# Patient Record
Sex: Female | Born: 1950 | Race: Black or African American | Hispanic: No | Marital: Married | Smoking: Never smoker
Health system: Southern US, Community
[De-identification: ages and names within clinical notes are randomized; demographics above are authoritative.]

## PROBLEM LIST (undated history)

## (undated) DIAGNOSIS — K219 Gastro-esophageal reflux disease without esophagitis: Secondary | ICD-10-CM

## (undated) DIAGNOSIS — R7302 Impaired glucose tolerance (oral): Secondary | ICD-10-CM

## (undated) DIAGNOSIS — N814 Uterovaginal prolapse, unspecified: Secondary | ICD-10-CM

## (undated) DIAGNOSIS — G43909 Migraine, unspecified, not intractable, without status migrainosus: Secondary | ICD-10-CM

## (undated) DIAGNOSIS — K602 Anal fissure, unspecified: Secondary | ICD-10-CM

## (undated) DIAGNOSIS — F32A Depression, unspecified: Secondary | ICD-10-CM

## (undated) DIAGNOSIS — IMO0001 Reserved for inherently not codable concepts without codable children: Secondary | ICD-10-CM

## (undated) DIAGNOSIS — K648 Other hemorrhoids: Secondary | ICD-10-CM

## (undated) DIAGNOSIS — L719 Rosacea, unspecified: Secondary | ICD-10-CM

## (undated) DIAGNOSIS — E739 Lactose intolerance, unspecified: Secondary | ICD-10-CM

## (undated) DIAGNOSIS — R011 Cardiac murmur, unspecified: Secondary | ICD-10-CM

## (undated) DIAGNOSIS — F419 Anxiety disorder, unspecified: Secondary | ICD-10-CM

## (undated) DIAGNOSIS — O24419 Gestational diabetes mellitus in pregnancy, unspecified control: Secondary | ICD-10-CM

## (undated) DIAGNOSIS — M2550 Pain in unspecified joint: Secondary | ICD-10-CM

## (undated) DIAGNOSIS — K589 Irritable bowel syndrome without diarrhea: Secondary | ICD-10-CM

## (undated) DIAGNOSIS — K5790 Diverticulosis of intestine, part unspecified, without perforation or abscess without bleeding: Secondary | ICD-10-CM

## (undated) DIAGNOSIS — K6389 Other specified diseases of intestine: Secondary | ICD-10-CM

## (undated) DIAGNOSIS — E785 Hyperlipidemia, unspecified: Secondary | ICD-10-CM

## (undated) DIAGNOSIS — G4700 Insomnia, unspecified: Secondary | ICD-10-CM

## (undated) DIAGNOSIS — F329 Major depressive disorder, single episode, unspecified: Secondary | ICD-10-CM

## (undated) HISTORY — DX: Rosacea, unspecified: L71.9

## (undated) HISTORY — DX: Other specified diseases of intestine: K63.89

## (undated) HISTORY — DX: Depression, unspecified: F32.A

## (undated) HISTORY — DX: Impaired glucose tolerance (oral): R73.02

## (undated) HISTORY — DX: Gestational diabetes mellitus in pregnancy, unspecified control: O24.419

## (undated) HISTORY — DX: Irritable bowel syndrome without diarrhea: K58.9

## (undated) HISTORY — DX: Lactose intolerance, unspecified: E73.9

## (undated) HISTORY — DX: Anal fissure, unspecified: K60.2

## (undated) HISTORY — DX: Major depressive disorder, single episode, unspecified: F32.9

## (undated) HISTORY — PX: COLONOSCOPY: SHX174

## (undated) HISTORY — DX: Anxiety disorder, unspecified: F41.9

## (undated) HISTORY — DX: Diverticulosis of intestine, part unspecified, without perforation or abscess without bleeding: K57.90

## (undated) HISTORY — DX: Other hemorrhoids: K64.8

## (undated) HISTORY — DX: Gastro-esophageal reflux disease without esophagitis: K21.9

## (undated) HISTORY — DX: Migraine, unspecified, not intractable, without status migrainosus: G43.909

## (undated) HISTORY — DX: Uterovaginal prolapse, unspecified: N81.4

## (undated) HISTORY — DX: Insomnia, unspecified: G47.00

## (undated) HISTORY — DX: Hyperlipidemia, unspecified: E78.5

## (undated) HISTORY — PX: UPPER GASTROINTESTINAL ENDOSCOPY: SHX188

## (undated) HISTORY — DX: Reserved for inherently not codable concepts without codable children: IMO0001

## (undated) HISTORY — DX: Cardiac murmur, unspecified: R01.1

## (undated) HISTORY — DX: Pain in unspecified joint: M25.50

---

## 1973-08-11 DIAGNOSIS — L719 Rosacea, unspecified: Secondary | ICD-10-CM

## 1973-08-11 HISTORY — DX: Rosacea, unspecified: L71.9

## 1996-08-11 DIAGNOSIS — K589 Irritable bowel syndrome without diarrhea: Secondary | ICD-10-CM

## 1996-08-11 DIAGNOSIS — E739 Lactose intolerance, unspecified: Secondary | ICD-10-CM

## 1996-08-11 HISTORY — DX: Irritable bowel syndrome, unspecified: K58.9

## 1996-08-11 HISTORY — DX: Lactose intolerance, unspecified: E73.9

## 1998-04-27 ENCOUNTER — Ambulatory Visit (HOSPITAL_COMMUNITY): Admission: RE | Admit: 1998-04-27 | Discharge: 1998-04-27 | Payer: Self-pay | Admitting: Obstetrics and Gynecology

## 1998-08-11 DIAGNOSIS — R7302 Impaired glucose tolerance (oral): Secondary | ICD-10-CM

## 1998-08-11 HISTORY — DX: Impaired glucose tolerance (oral): R73.02

## 1999-03-07 ENCOUNTER — Other Ambulatory Visit: Admission: RE | Admit: 1999-03-07 | Discharge: 1999-03-07 | Payer: Self-pay | Admitting: Obstetrics and Gynecology

## 1999-05-28 ENCOUNTER — Encounter: Payer: Self-pay | Admitting: Obstetrics and Gynecology

## 1999-05-28 ENCOUNTER — Ambulatory Visit (HOSPITAL_COMMUNITY): Admission: RE | Admit: 1999-05-28 | Discharge: 1999-05-28 | Payer: Self-pay | Admitting: Obstetrics and Gynecology

## 1999-07-12 ENCOUNTER — Encounter: Payer: Self-pay | Admitting: Family Medicine

## 1999-07-12 ENCOUNTER — Encounter: Admission: RE | Admit: 1999-07-12 | Discharge: 1999-07-12 | Payer: Self-pay | Admitting: Family Medicine

## 1999-08-12 DIAGNOSIS — K219 Gastro-esophageal reflux disease without esophagitis: Secondary | ICD-10-CM

## 1999-08-12 HISTORY — DX: Gastro-esophageal reflux disease without esophagitis: K21.9

## 2000-04-09 ENCOUNTER — Encounter: Admission: RE | Admit: 2000-04-09 | Discharge: 2000-04-09 | Payer: Self-pay | Admitting: Obstetrics and Gynecology

## 2000-04-09 ENCOUNTER — Encounter: Payer: Self-pay | Admitting: Obstetrics and Gynecology

## 2000-06-29 ENCOUNTER — Other Ambulatory Visit: Admission: RE | Admit: 2000-06-29 | Discharge: 2000-06-29 | Payer: Self-pay | Admitting: Obstetrics and Gynecology

## 2001-06-11 ENCOUNTER — Encounter: Payer: Self-pay | Admitting: Obstetrics and Gynecology

## 2001-06-11 ENCOUNTER — Ambulatory Visit (HOSPITAL_COMMUNITY): Admission: RE | Admit: 2001-06-11 | Discharge: 2001-06-11 | Payer: Self-pay | Admitting: Obstetrics and Gynecology

## 2001-06-29 ENCOUNTER — Other Ambulatory Visit: Admission: RE | Admit: 2001-06-29 | Discharge: 2001-06-29 | Payer: Self-pay | Admitting: Obstetrics and Gynecology

## 2001-08-11 DIAGNOSIS — G4700 Insomnia, unspecified: Secondary | ICD-10-CM

## 2001-08-11 DIAGNOSIS — K5790 Diverticulosis of intestine, part unspecified, without perforation or abscess without bleeding: Secondary | ICD-10-CM

## 2001-08-11 HISTORY — DX: Diverticulosis of intestine, part unspecified, without perforation or abscess without bleeding: K57.90

## 2001-08-11 HISTORY — DX: Insomnia, unspecified: G47.00

## 2001-10-22 ENCOUNTER — Encounter: Admission: RE | Admit: 2001-10-22 | Discharge: 2001-10-22 | Payer: Self-pay | Admitting: Family Medicine

## 2001-10-22 ENCOUNTER — Encounter: Payer: Self-pay | Admitting: Family Medicine

## 2002-04-07 ENCOUNTER — Ambulatory Visit (HOSPITAL_COMMUNITY): Admission: RE | Admit: 2002-04-07 | Discharge: 2002-04-07 | Payer: Self-pay | Admitting: Gastroenterology

## 2002-08-12 ENCOUNTER — Ambulatory Visit (HOSPITAL_COMMUNITY): Admission: RE | Admit: 2002-08-12 | Discharge: 2002-08-12 | Payer: Self-pay | Admitting: Obstetrics and Gynecology

## 2002-08-12 ENCOUNTER — Encounter: Payer: Self-pay | Admitting: Obstetrics and Gynecology

## 2002-08-31 ENCOUNTER — Other Ambulatory Visit: Admission: RE | Admit: 2002-08-31 | Discharge: 2002-08-31 | Payer: Self-pay | Admitting: Obstetrics and Gynecology

## 2002-12-28 ENCOUNTER — Encounter: Admission: RE | Admit: 2002-12-28 | Discharge: 2002-12-28 | Payer: Self-pay | Admitting: Obstetrics and Gynecology

## 2002-12-28 ENCOUNTER — Encounter: Payer: Self-pay | Admitting: Obstetrics and Gynecology

## 2003-08-12 DIAGNOSIS — IMO0001 Reserved for inherently not codable concepts without codable children: Secondary | ICD-10-CM

## 2003-08-12 HISTORY — DX: Reserved for inherently not codable concepts without codable children: IMO0001

## 2003-09-20 ENCOUNTER — Other Ambulatory Visit: Admission: RE | Admit: 2003-09-20 | Discharge: 2003-09-20 | Payer: Self-pay | Admitting: Obstetrics and Gynecology

## 2003-11-16 ENCOUNTER — Ambulatory Visit (HOSPITAL_COMMUNITY): Admission: RE | Admit: 2003-11-16 | Discharge: 2003-11-16 | Payer: Self-pay | Admitting: Family Medicine

## 2004-11-22 ENCOUNTER — Encounter: Admission: RE | Admit: 2004-11-22 | Discharge: 2004-11-22 | Payer: Self-pay | Admitting: Gastroenterology

## 2004-12-30 ENCOUNTER — Ambulatory Visit (HOSPITAL_COMMUNITY): Admission: RE | Admit: 2004-12-30 | Discharge: 2004-12-30 | Payer: Self-pay | Admitting: Obstetrics and Gynecology

## 2005-10-07 ENCOUNTER — Other Ambulatory Visit: Admission: RE | Admit: 2005-10-07 | Discharge: 2005-10-07 | Payer: Self-pay | Admitting: Obstetrics and Gynecology

## 2006-01-01 ENCOUNTER — Ambulatory Visit (HOSPITAL_COMMUNITY): Admission: RE | Admit: 2006-01-01 | Discharge: 2006-01-01 | Payer: Self-pay | Admitting: Obstetrics and Gynecology

## 2006-06-18 ENCOUNTER — Emergency Department (HOSPITAL_COMMUNITY): Admission: EM | Admit: 2006-06-18 | Discharge: 2006-06-18 | Payer: Self-pay | Admitting: Family Medicine

## 2007-01-07 ENCOUNTER — Ambulatory Visit (HOSPITAL_COMMUNITY): Admission: RE | Admit: 2007-01-07 | Discharge: 2007-01-07 | Payer: Self-pay | Admitting: Obstetrics and Gynecology

## 2008-05-07 ENCOUNTER — Emergency Department (HOSPITAL_COMMUNITY): Admission: EM | Admit: 2008-05-07 | Discharge: 2008-05-07 | Payer: Self-pay | Admitting: Family Medicine

## 2008-08-07 ENCOUNTER — Ambulatory Visit (HOSPITAL_COMMUNITY): Admission: RE | Admit: 2008-08-07 | Discharge: 2008-08-07 | Payer: Self-pay | Admitting: Family Medicine

## 2008-08-08 ENCOUNTER — Encounter: Admission: RE | Admit: 2008-08-08 | Discharge: 2008-08-08 | Payer: Self-pay | Admitting: Family Medicine

## 2009-08-31 ENCOUNTER — Ambulatory Visit (HOSPITAL_COMMUNITY): Admission: RE | Admit: 2009-08-31 | Discharge: 2009-08-31 | Payer: Self-pay | Admitting: Obstetrics and Gynecology

## 2010-06-21 ENCOUNTER — Encounter: Admission: RE | Admit: 2010-06-21 | Discharge: 2010-06-21 | Payer: Self-pay | Admitting: Family Medicine

## 2010-09-05 ENCOUNTER — Ambulatory Visit (HOSPITAL_COMMUNITY)
Admission: RE | Admit: 2010-09-05 | Discharge: 2010-09-05 | Payer: Self-pay | Source: Home / Self Care | Attending: Obstetrics and Gynecology | Admitting: Obstetrics and Gynecology

## 2010-11-13 ENCOUNTER — Encounter: Payer: BC Managed Care – PPO | Attending: Family Medicine | Admitting: *Deleted

## 2010-11-13 DIAGNOSIS — E739 Lactose intolerance, unspecified: Secondary | ICD-10-CM | POA: Insufficient documentation

## 2010-11-13 DIAGNOSIS — Z713 Dietary counseling and surveillance: Secondary | ICD-10-CM | POA: Insufficient documentation

## 2010-12-27 NOTE — Op Note (Signed)
   [name], [name] NO.:  [id number]   MEDICAL RECORD NO.:  [id number]                   PATIENT TYPE:  AMB   LOCATION:  ENDO                                 FACILITY:  [facility]   PHYSICIAN:  Charna Elizabeth, M.D.                   DATE OF BIRTH:  1950-08-28   DATE OF PROCEDURE:  04/07/2002  DATE OF DISCHARGE:  04/07/2002                                 OPERATIVE REPORT   PROCEDURE:  Screening colonoscopy.   ENDOSCOPIST:  Charna Elizabeth, M.D.   INSTRUMENT USED:  Olympus video colonoscope.   INDICATIONS FOR PROCEDURE:  60 year old white female with a history of loose  stools, rule out colitis.   PREPROCEDURE PREPARATION:  Informed consent was obtained from the patient.  The patient was fasted for eight hours prior to the procedure and prepped  with a bottle of magnesium citrate and a gallon of NuLytely the night prior  to the procedure.   PREPROCEDURE PHYSICAL:  Patient with stable vital signs.  Neck supple.  Chest clear to auscultation.  S1 and S2 regular.  Abdomen soft with normal  bowel sounds.   DESCRIPTION OF PROCEDURE:  The patient was placed in the left lateral  decubitus position, sedated with 80 mg of Demerol and 8 mg Versed  intravenously.  Once the patient was adequately sedated, maintained on low  flow oxygen, continuous cardiac monitoring, the Olympus video colonoscope  was advanced from the rectum to the cecum without difficulty.  Except for a  few scattered diverticula found in the colon, no masses, polyps, erosions,  ulcerations, etc., were seen.  The patient tolerated the procedure well  without complications.  The procedure was completed to the cecum, the  appendiceal orifice and ileocecal valve along with the terminal ileum were  clearly visualized.   IMPRESSION:  Essentially unrevealing colonoscopy except for a few scattered  diverticula.   RECOMMENDATIONS:  1. A high fiber diet has been recommended on an outpatient basis.  2.  Repeat guaiac testing.  3. Further recommendations made as need arises.                                               Charna Elizabeth, M.D.    JM/MEDQ  D:  04/07/2002  T:  04/11/2002  Job:  [id number]   cc:   Carolyne Fiscal, M.D.

## 2011-01-20 ENCOUNTER — Ambulatory Visit: Payer: BC Managed Care – PPO | Admitting: Internal Medicine

## 2011-02-09 ENCOUNTER — Inpatient Hospital Stay (INDEPENDENT_AMBULATORY_CARE_PROVIDER_SITE_OTHER)
Admission: RE | Admit: 2011-02-09 | Discharge: 2011-02-09 | Disposition: A | Discharge status: Home / Self Care | Payer: BC Managed Care – PPO | Source: Ambulatory Visit | Attending: Emergency Medicine | Admitting: Emergency Medicine

## 2011-02-09 DIAGNOSIS — T6391XA Toxic effect of contact with unspecified venomous animal, accidental (unintentional), initial encounter: Secondary | ICD-10-CM

## 2011-02-17 ENCOUNTER — Encounter: Payer: BC Managed Care – PPO | Attending: Family Medicine | Admitting: *Deleted

## 2011-02-17 ENCOUNTER — Encounter: Payer: Self-pay | Admitting: *Deleted

## 2011-02-17 DIAGNOSIS — Z713 Dietary counseling and surveillance: Secondary | ICD-10-CM | POA: Insufficient documentation

## 2011-02-17 DIAGNOSIS — E739 Lactose intolerance, unspecified: Secondary | ICD-10-CM | POA: Insufficient documentation

## 2011-02-17 NOTE — Progress Notes (Signed)
Medical Nutrition Therapy:  Appt start time: 9:00am; Appt end time: 9:30am  Assessment:  Primary concerns today: Follow up visit - Glucose Intolerance/Diet Therapy  Weight today: 163.8 lbs Weight change: + 3.8 lbs BMI: 26.4 kg/m2   Pt reports increased CHO consumption 2/2 dx of salicylate intolerance in significant other.  Reports 3 meals/day plus ~1 snack.  Inconsistent CHO intake noted. Consuming more starchy vegetables and trail mix with dark chocolate, cranberries, raisins. No exercise outside of ADLs and light yard work.  Dietary intake includes: peas, corn, carrots, oatmeal with raisins, [place] sausage biscuits ("not often"), fruit (1 cup), 1/4 pc cheesecake, trail mix, some sugar-sweetened beverages (usually uses artificial sweetener).  24-hr Dietary Recall: B: 65 gm CHO L: 15 gm CHO D: 30-45 gm CHO Snack (@ hs): 30-45 gm CHO  Medications:  Reports Actonel 35 mg taken once weekly (since 02/2009).  Progress Towards Goal(s):  No progress noted.   Nutritional Diagnosis: Disordered eating pattern related to inconsistent carbohydrate intake as evidenced by patient-reported food recall and an A1c of 6.0%.  Intervention:   Consistent CHO intake over 3 meals plus 1-2 snacks per day (30-40 gm CHO @ meals/15 gm @ snacks)  Add protein to all meals and snacks  Resume exercising via walking or try water walking  Keep food journal on sheet provided for next visit -- Aim for 2-3 days per week and 1 day on weekends  Follow up PRN  Monitoring/Evaluation:  Dietary intake, exercise, and body weight.  Call or email with questions or concerns and follow up as needed.

## 2011-02-17 NOTE — Patient Instructions (Signed)
New patient set goals:  Consistent carbohydrate intake over 3 meals and 1-2 snacks per day (30-40 gm @ meals/15 gm @ snacks)  Add protein to all meals and snacks  Resume exercising via walking (or try water walking); Aim for 3 days per week for 20-30 min. each   Keep food journal on sheet provided for next visit -- Aim for 2-3 days per week and 1 day on weekends  Follow up PRN

## 2011-02-17 NOTE — Progress Notes (Deleted)
Subjective:     Patient ID: Deborah Conner, female   DOB: 1951-02-06, 60 y.o.   MRN: [id number]  HPI   Review of Systems     Objective:   Physical Exam     Assessment:     ***    Plan:     ***

## 2011-02-24 ENCOUNTER — Encounter: Payer: Self-pay | Admitting: Internal Medicine

## 2011-02-24 ENCOUNTER — Ambulatory Visit (INDEPENDENT_AMBULATORY_CARE_PROVIDER_SITE_OTHER): Payer: BC Managed Care – PPO | Admitting: Internal Medicine

## 2011-02-24 VITALS — BP 116/78 | HR 92 | Ht 66.0 in | Wt 166.6 lb

## 2011-02-24 DIAGNOSIS — R1319 Other dysphagia: Secondary | ICD-10-CM

## 2011-02-24 DIAGNOSIS — Z1211 Encounter for screening for malignant neoplasm of colon: Secondary | ICD-10-CM

## 2011-02-24 DIAGNOSIS — K649 Unspecified hemorrhoids: Secondary | ICD-10-CM

## 2011-02-24 DIAGNOSIS — K589 Irritable bowel syndrome without diarrhea: Secondary | ICD-10-CM

## 2011-02-24 DIAGNOSIS — E739 Lactose intolerance, unspecified: Secondary | ICD-10-CM

## 2011-02-24 MED ORDER — HYOSCYAMINE SULFATE 0.125 MG SL SUBL: 0.1250 mg | SUBLINGUAL_TABLET | SUBLINGUAL | Status: DC | PRN

## 2011-02-24 MED ORDER — PEG-KCL-NACL-NASULF-NA ASC-C 100 G PO SOLR: 1.0000 | Freq: Once | ORAL | Status: DC

## 2011-02-24 NOTE — Assessment & Plan Note (Signed)
Sounds like esophageal dysphagia. Needs to be assessed with EGD and possible esophageal dilation depending upon what is seen. She is correct that Actonel could be contributing.GERD, motility disturbance and even neoplasm also possible. Risks, benefits and indications of endoscopy and dilation described and she understands.

## 2011-02-24 NOTE — Assessment & Plan Note (Signed)
Continue using lactaid or avoiding.

## 2011-02-24 NOTE — Assessment & Plan Note (Signed)
Sounds like she bled once from these as diagnosed by Dr. Duaine Dredge.

## 2011-02-24 NOTE — Progress Notes (Signed)
Subjective:    Patient ID: Deborah Conner, female    DOB: [birth year], 60 y.o.   MRN: [id number]  HPI this is a very pleasant 60 year old [place] American woman who presents for evaluation of prior rectal bleeding. She also has complaints of dysphagia.  In April she had one episode of rectal bleeding, she was assessed by her PCP and determined to have hemorrhoids, she use hemorrhoidal suppositories with hydrocortisone and has had no more symptoms. She did a colonoscopy about 9-10 years ago which was unremarkable apparently.  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 This is been something that is less frequent over time but is still a problem. In years past she used level sublingual with relief. She has tried hyoscyamine tablets by mouth without the same benefit.  She is also describing 7-8 months of intermittent solid food dysphagia described as a subxiphoid sticking point with solid foods, she'll drink water and the food will pass. This is occurring about once a month does have a history of an EGD for abdominal pain UA and see back in 2007 which looked normal. She takes Actonel for about 2 years and is concerned this may be related to her swallowing difficulties. Her GI review of systems is otherwise notable for some heartburn, gas symptoms and she has changed weight.   Past Medical History  Diagnosis Date  . Gestational diabetes   . IBS (irritable bowel syndrome) 1998  . Glucose intolerance (impaired glucose tolerance) 2000  . Anal fissure   . Internal hemorrhoids   . Diverticulosis 2003  . Anxiety and depression 1998/1973    some panic  . HLD (hyperlipidemia)   . Lactose intolerance 1998  . Osteoporosis 2010  . Polyarthralgia   . Rosacea 1975  . Tinnitus 1999  . Migraine headache childhood  . Seborrheic dermatitis age 53  . GERD (gastroesophageal reflux disease) 2001  . Insomnia  2003  . Aortic sclerosis 2005  . DJD (degenerative joint disease), lumbosacral 2009   Past Surgical History  Procedure Date  . Upper gastrointestinal endoscopy 01/30/2006    Normal [facility])  . Colonoscopy 2003    reports that she has never smoked. She has never used smokeless tobacco. She reports that she does not drink alcohol or use illicit drugs. family history includes Arthritis in her sister; Diabetes in her sister; Gout in her mother; Hearing loss in her father; Heart disease in her mother; Hypertension in her mother; Osteoporosis in her mother; and Raynaud syndrome in her daughter. Allergies  Allergen Reactions  . Lactose Intolerance (Gi)   . Chlorthalidone Rash     Current outpatient prescriptions:ALPRAZolam (XANAX) 0.25 MG tablet, Take 0.125 mg by mouth as needed. (Pt takes 1/2 of 0.25 mg prn) , Disp: , Rfl: ;  aspirin 81 MG tablet, Take 81 mg by mouth daily.  , Disp: , Rfl: ;  CALCIUM PO, Take by mouth daily. Dose not specified. , Disp: , Rfl: ;  cholecalciferol (VITAMIN D) 1000 UNITS tablet, Take 1,000 Units by mouth 2 (two) times daily.  , Disp: , Rfl:  Evening Primrose Oil CAPS, Take 1 capsule by mouth daily.  , Disp: , Rfl: ;  FUROSEMIDE PO, Take 10 mg by mouth daily.  , Disp: , Rfl: ;  Omega-3 Fatty Acids (FISH OIL PO), Take 1 tablet by mouth daily.  , Disp: , Rfl: ;  risedronate (ACTONEL) 35 MG tablet, Take 35 mg  by mouth every 7 (seven) days. with water on empty stomach, nothing by mouth or lie down for next 30 minutes. , Disp: , Rfl:  vitamin B-12 (CYANOCOBALAMIN) 100 MCG tablet, Take 50 mcg by mouth daily.  , Disp: , Rfl: ;  hyoscyamine (LEVSIN SL) 0.125 MG SL tablet, Place 1 tablet (0.125 mg total) under the tongue every 4 (four) hours as needed for cramping. And to prevent diarrhea., Disp: 60 tablet, Rfl: 3;  peg 3350 powder (MOVIPREP) 100 G SOLR, Take 1 kit (100 g total) by mouth once., Disp: 1 kit, Rfl: 0    Review of Systems All other review of systems negative except as  above    Objective:   Physical Exam This is a well-developed well-nourished black woman in no acute distress Her eyes are anicteric pupils round and reactive to light The mouth and posterior pharynx are clear of lesions teeth are in good repair The neck is supple without mass or thyromegaly The lungs are clear to auscultation Heart S1-S2 no rubs murmurs or gallops heard Abdomen soft and nontender without organomegaly or mass Rectal examination is deferred Lower extremities are free of edema She is alert and oriented and has an appropriate mood and affect There are no supraclavicular or cervical lymph nodes detected on exam       Assessment & Plan:  I think the rectal bleeding was hemorrhoidal in origin since it was only one time and responded to treatment. We will have her perform a screening colonoscopy for routine colorectal cancer screening, I think she is in the window at appropriate intervals this point.  Please see the assessment and plans for dysphagia and her IBS as well.

## 2011-02-24 NOTE — Assessment & Plan Note (Signed)
Classic story. Try sublingual hyoscyamine rather than oral to see if it works better.

## 2011-02-24 NOTE — Patient Instructions (Addendum)
You have been scheduled for an Endoscopy/Colonoscopy with separate instructions given. Your prep kit has been sent to your pharmacy for you to pick up. Your prescription(s) has(have) been sent to your pharmacy for you to pick up. Stop the hyoscyamine tablet and start the sublingual tablets

## 2011-03-06 ENCOUNTER — Encounter: Payer: BC Managed Care – PPO | Admitting: Internal Medicine

## 2011-04-11 ENCOUNTER — Encounter: Payer: Self-pay | Admitting: Internal Medicine

## 2011-04-11 ENCOUNTER — Ambulatory Visit (AMBULATORY_SURGERY_CENTER): Payer: BC Managed Care – PPO | Admitting: Internal Medicine

## 2011-04-11 VITALS — BP 153/98 | HR 90 | Temp 98.0°F | Resp 16 | Ht 66.0 in | Wt 163.0 lb

## 2011-04-11 DIAGNOSIS — D126 Benign neoplasm of colon, unspecified: Secondary | ICD-10-CM

## 2011-04-11 DIAGNOSIS — K648 Other hemorrhoids: Secondary | ICD-10-CM

## 2011-04-11 DIAGNOSIS — K5730 Diverticulosis of large intestine without perforation or abscess without bleeding: Secondary | ICD-10-CM

## 2011-04-11 DIAGNOSIS — Z1211 Encounter for screening for malignant neoplasm of colon: Secondary | ICD-10-CM

## 2011-04-11 DIAGNOSIS — R1319 Other dysphagia: Secondary | ICD-10-CM

## 2011-04-11 MED ORDER — SODIUM CHLORIDE 0.9 % IV SOLN: 500.0000 mL | INTRAVENOUS | Status: DC

## 2011-04-11 MED ORDER — RISEDRONATE SODIUM 35 MG PO TABS: 35.0000 mg | ORAL_TABLET | ORAL | Status: DC

## 2011-04-11 NOTE — Assessment & Plan Note (Signed)
This is resolved after stopping Actonel. EGD was normal 04/11/11

## 2011-04-11 NOTE — Patient Instructions (Addendum)
The esophagus, stomach and duodenum are normal. It probably is th Actonel causing the swallowing problem. Stay off that and discuss alternative treatment for bones with Dr. Duaine Dredge. The colonoscopy revealed a small and benign-appearing polyp that was removed, diverticulosis and internal hemorrhoids. A good result. I anticipate the soonest you would need a routine repeat colonoscopy would be 5 years, perhaps longer. Iva Boop, MD, [facility]  Please follow all discharge instructions given to you by the recovery room nurse. If you have any questions or problems after discharge please call [phone]. You will receive a phone call in the am to see how you are doing and answer any questions you may have. Thank you for choosing [facility] for your health care needs.

## 2011-04-12 ENCOUNTER — Encounter: Payer: Self-pay | Admitting: Internal Medicine

## 2011-04-15 ENCOUNTER — Telehealth: Payer: Self-pay | Admitting: *Deleted

## 2011-04-15 NOTE — Telephone Encounter (Signed)
Follow up Call- Patient questions:Pt was at work, spoke with her husband  Do you have a fever, pain , or abdominal swelling? no Pain Score  0 *  Have you tolerated food without any problems? yes  Have you been able to return to your normal activities? yes  Do you have any questions about your discharge instructions: Diet   no Medications  no Follow up visit  no  Do you have questions or concerns about your Care? no  Actions: * If pain score is 4 or above: No action needed, pain <4.   

## 2011-07-25 DIAGNOSIS — J309 Allergic rhinitis, unspecified: Secondary | ICD-10-CM | POA: Insufficient documentation

## 2011-07-25 DIAGNOSIS — H9319 Tinnitus, unspecified ear: Secondary | ICD-10-CM | POA: Insufficient documentation

## 2011-07-28 ENCOUNTER — Other Ambulatory Visit: Payer: Self-pay | Admitting: Family Medicine

## 2011-08-01 ENCOUNTER — Other Ambulatory Visit: Payer: BC Managed Care – PPO

## 2011-08-06 ENCOUNTER — Other Ambulatory Visit: Payer: BC Managed Care – PPO

## 2011-08-08 ENCOUNTER — Other Ambulatory Visit: Payer: Self-pay | Admitting: Family Medicine

## 2011-08-08 DIAGNOSIS — M25551 Pain in right hip: Secondary | ICD-10-CM

## 2011-08-08 DIAGNOSIS — M79672 Pain in left foot: Secondary | ICD-10-CM

## 2011-08-11 ENCOUNTER — Other Ambulatory Visit: Payer: BC Managed Care – PPO

## 2011-08-13 ENCOUNTER — Ambulatory Visit
Admission: RE | Admit: 2011-08-13 | Discharge: 2011-08-13 | Disposition: A | Discharge status: Home / Self Care | Payer: BC Managed Care – PPO | Source: Ambulatory Visit | Attending: Family Medicine | Admitting: Family Medicine

## 2011-08-13 DIAGNOSIS — M79672 Pain in left foot: Secondary | ICD-10-CM

## 2011-08-13 DIAGNOSIS — M25551 Pain in right hip: Secondary | ICD-10-CM

## 2011-09-02 ENCOUNTER — Other Ambulatory Visit (HOSPITAL_COMMUNITY): Payer: Self-pay | Admitting: Obstetrics and Gynecology

## 2011-09-02 DIAGNOSIS — Z1231 Encounter for screening mammogram for malignant neoplasm of breast: Secondary | ICD-10-CM

## 2011-09-30 ENCOUNTER — Ambulatory Visit (HOSPITAL_COMMUNITY)
Admission: RE | Admit: 2011-09-30 | Discharge: 2011-09-30 | Disposition: A | Discharge status: Home / Self Care | Payer: BC Managed Care – PPO | Source: Ambulatory Visit | Attending: Obstetrics and Gynecology | Admitting: Obstetrics and Gynecology

## 2011-09-30 DIAGNOSIS — Z1231 Encounter for screening mammogram for malignant neoplasm of breast: Secondary | ICD-10-CM

## 2011-10-06 ENCOUNTER — Other Ambulatory Visit: Payer: Self-pay | Admitting: Obstetrics and Gynecology

## 2011-10-06 DIAGNOSIS — R928 Other abnormal and inconclusive findings on diagnostic imaging of breast: Secondary | ICD-10-CM

## 2011-10-07 ENCOUNTER — Ambulatory Visit
Admission: RE | Admit: 2011-10-07 | Discharge: 2011-10-07 | Disposition: A | Discharge status: Home / Self Care | Payer: BC Managed Care – PPO | Source: Ambulatory Visit | Attending: Obstetrics and Gynecology | Admitting: Obstetrics and Gynecology

## 2011-10-07 DIAGNOSIS — R928 Other abnormal and inconclusive findings on diagnostic imaging of breast: Secondary | ICD-10-CM

## 2011-10-07 IMAGING — MG MM DIGITAL DIAGNOSTIC LIMITED*L*
3 series · 3 of 3 positions shown · non-contrast
Comparison: [DATE], [DATE], dating back to [DATE].

CLINICAL DATA: Called back from screening mammography, possible
left breast masses.

DIGITAL DIAGNOSTIC LEFT MAMMOGRAM

[L CC]
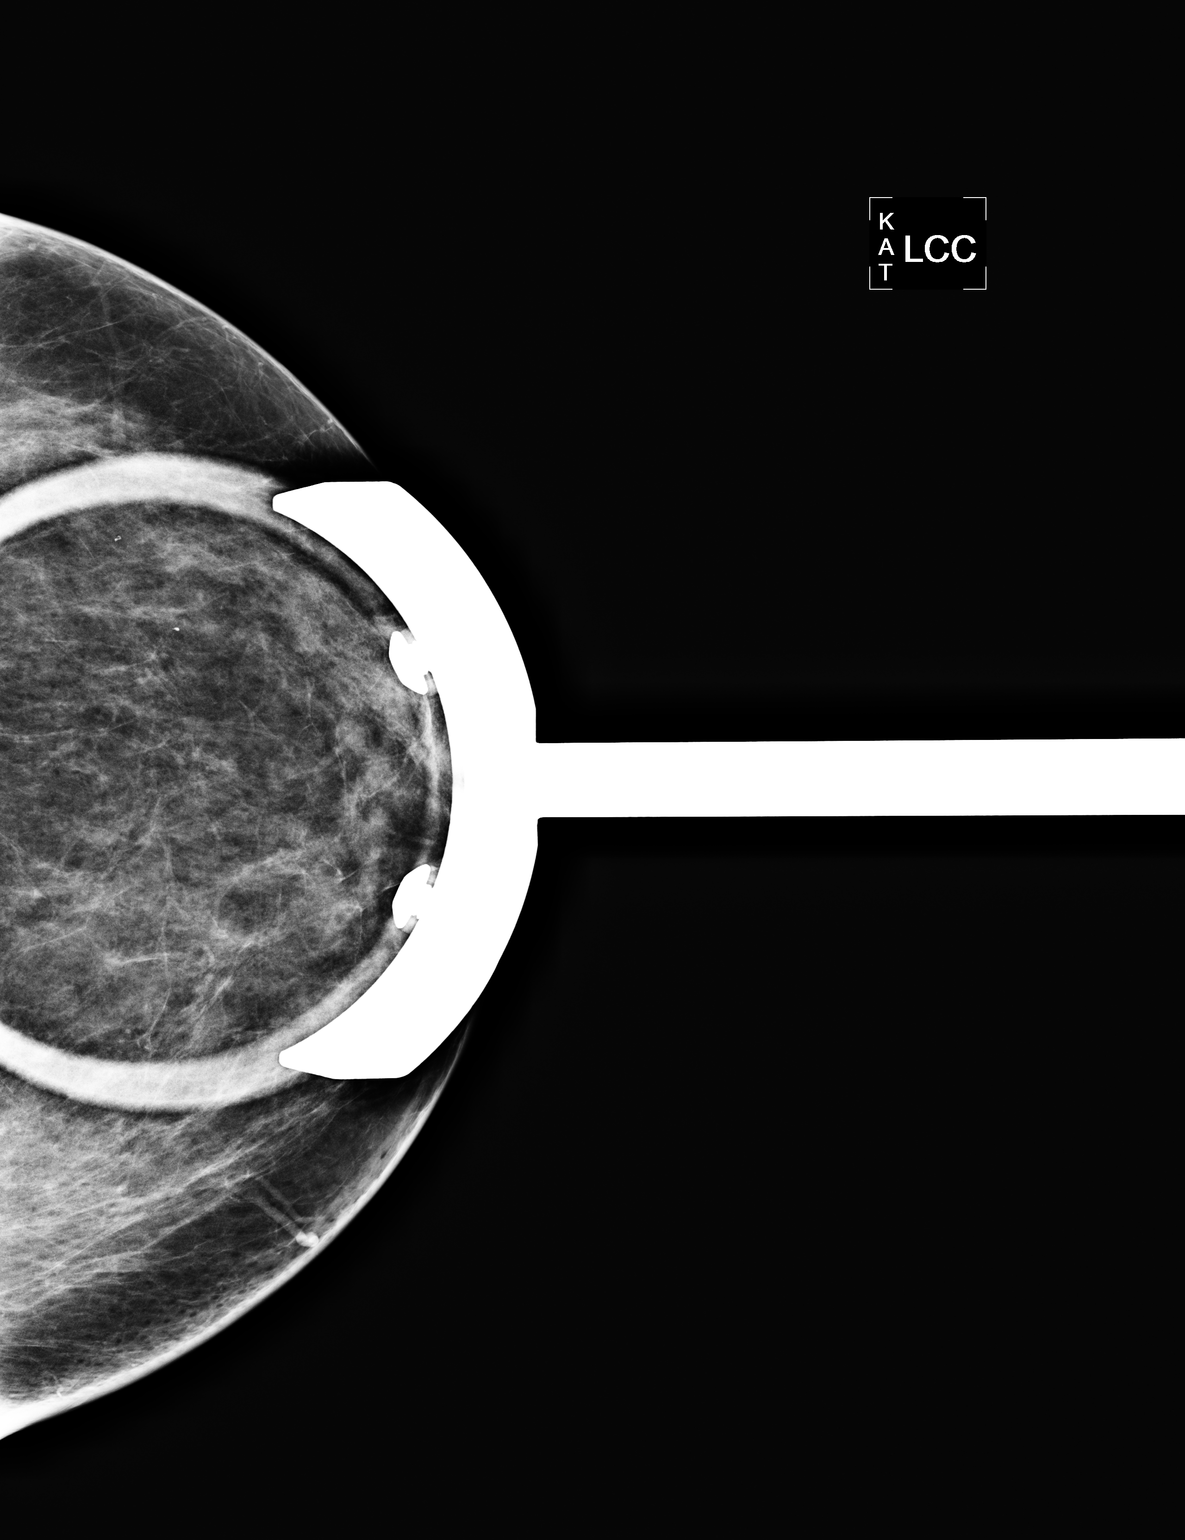

[L MLO (1 of 2)]
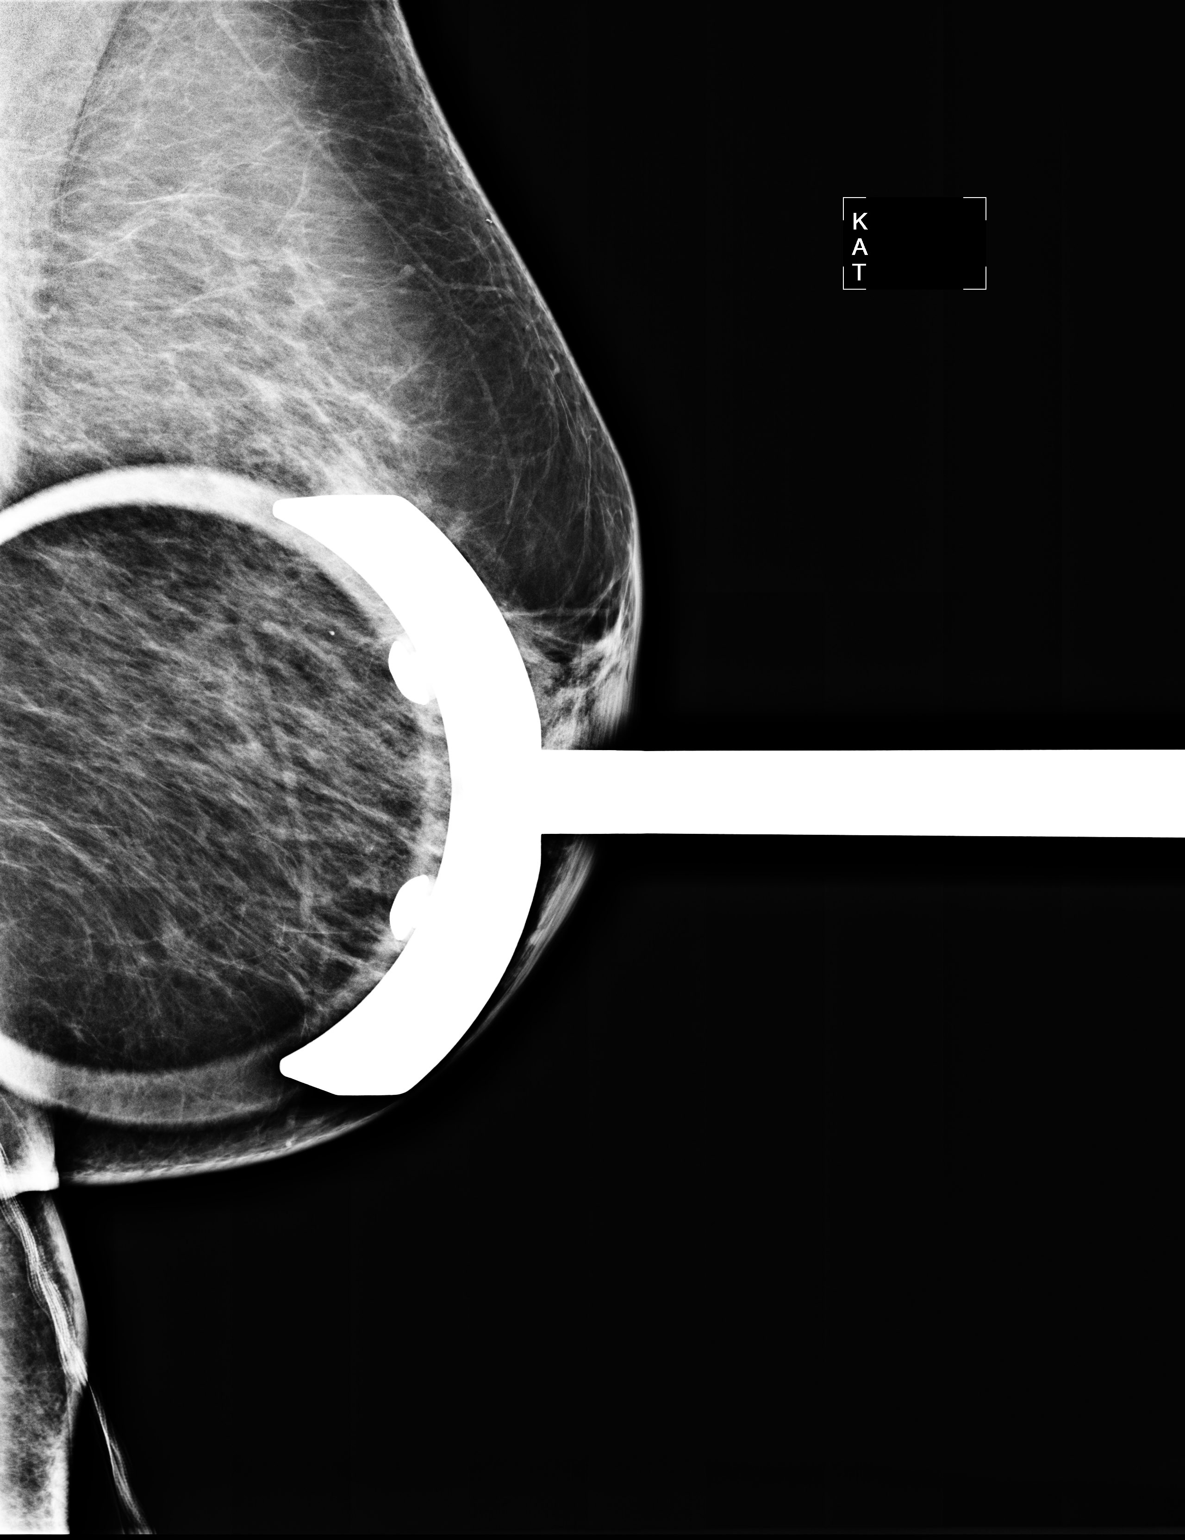

[L MLO (2 of 2)]
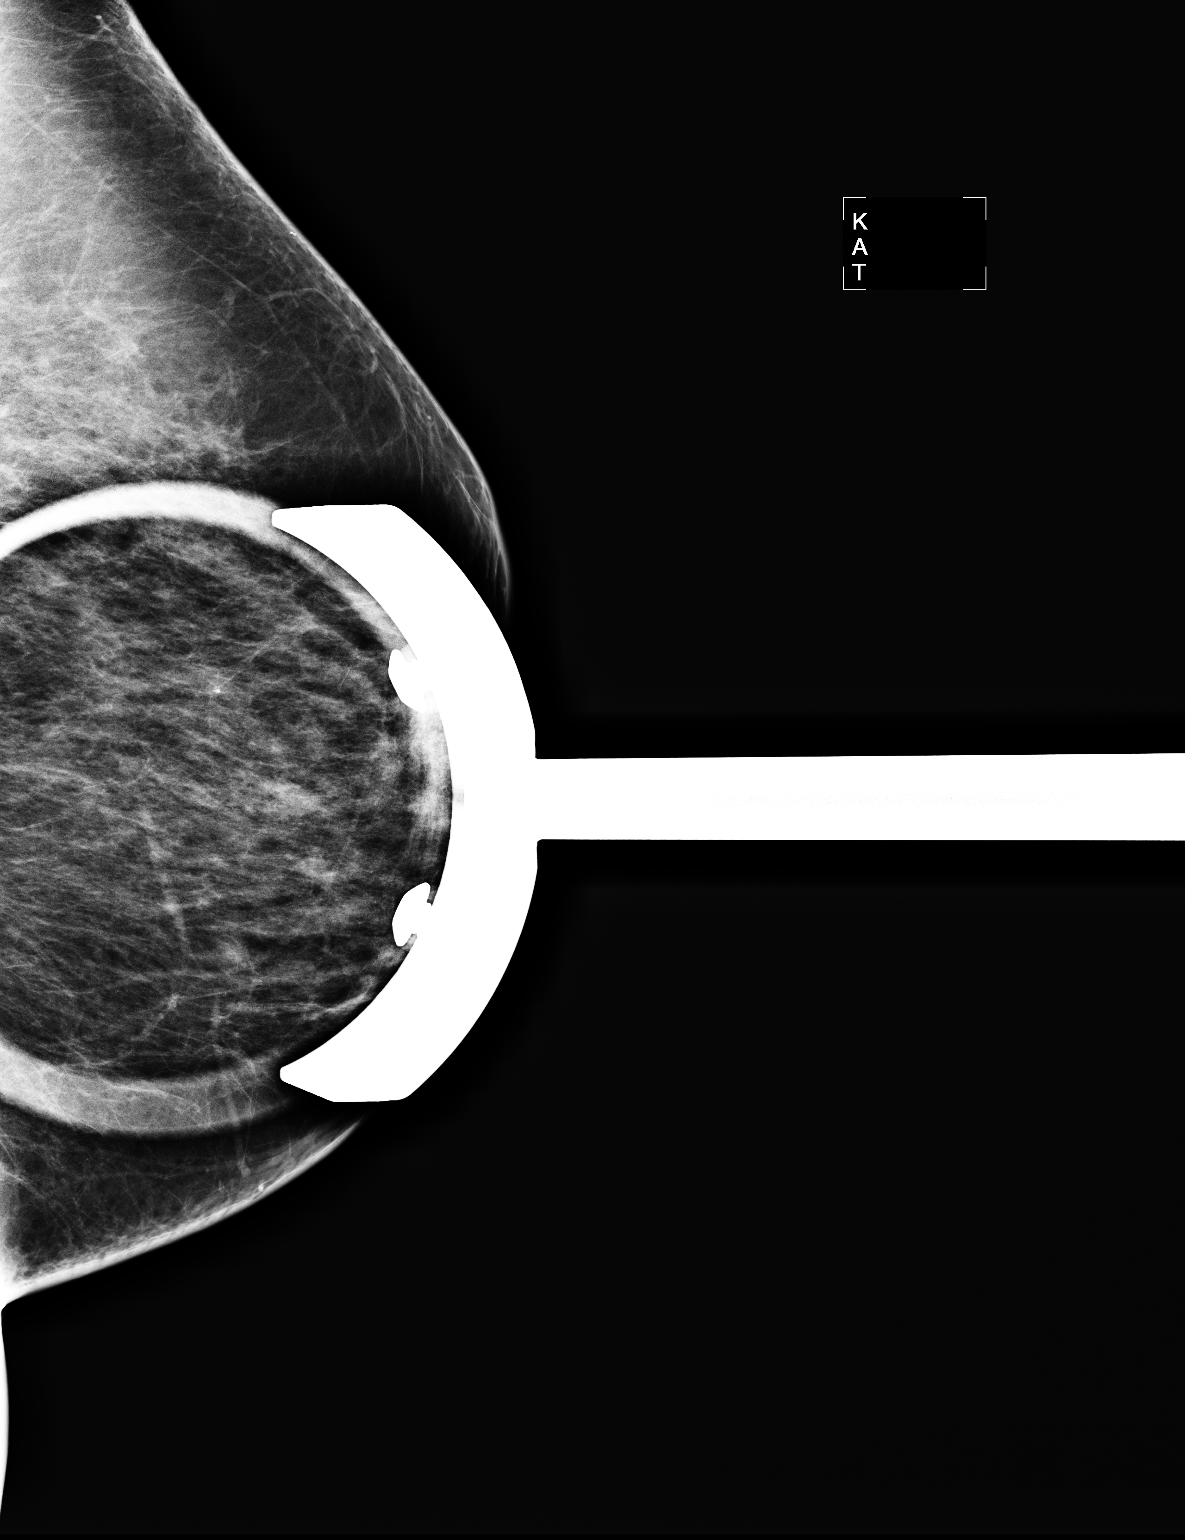

[3 of 3 positions shown; findings below may reference images not displayed]

FINDINGS: Spot compression CC and MLO views were obtained.  No
mass, architectural distortion, or suspicious calcifications in the
left breast.
IMPRESSION: No mammographic evidence of malignancy, left breast.  The
abnormality questioned on the screening mammogram is consistent
with a summation of overlapping fibroglandular tissue.

Recommendation:  Annual screening mammography in 1 year, YOSHI,

BI-RADS CATEGORY 1:  Negative.

## 2011-12-09 ENCOUNTER — Ambulatory Visit (INDEPENDENT_AMBULATORY_CARE_PROVIDER_SITE_OTHER): Payer: BC Managed Care – PPO | Admitting: Obstetrics and Gynecology

## 2011-12-09 ENCOUNTER — Encounter: Payer: Self-pay | Admitting: Obstetrics and Gynecology

## 2011-12-09 VITALS — BP 124/70 | Wt 172.0 lb

## 2011-12-09 DIAGNOSIS — N939 Abnormal uterine and vaginal bleeding, unspecified: Secondary | ICD-10-CM

## 2011-12-09 DIAGNOSIS — N814 Uterovaginal prolapse, unspecified: Secondary | ICD-10-CM

## 2011-12-09 DIAGNOSIS — N898 Other specified noninflammatory disorders of vagina: Secondary | ICD-10-CM

## 2011-12-09 DIAGNOSIS — M810 Age-related osteoporosis without current pathological fracture: Secondary | ICD-10-CM | POA: Insufficient documentation

## 2011-12-09 DIAGNOSIS — E663 Overweight: Secondary | ICD-10-CM | POA: Insufficient documentation

## 2011-12-09 DIAGNOSIS — N951 Menopausal and female climacteric states: Secondary | ICD-10-CM

## 2011-12-09 DIAGNOSIS — R102 Pelvic and perineal pain: Secondary | ICD-10-CM | POA: Insufficient documentation

## 2011-12-09 DIAGNOSIS — N9489 Other specified conditions associated with female genital organs and menstrual cycle: Secondary | ICD-10-CM

## 2011-12-09 HISTORY — DX: Uterovaginal prolapse, unspecified: N81.4

## 2011-12-09 NOTE — Patient Instructions (Signed)
Prolapse  Prolapse means the falling down, bulging, dropping, or drooping of a body part. Organs that commonly prolapse include the rectum, small intestine, bladder, urethra, vagina (birth canal), uterus (womb), and cervix. Prolapse occurs when the ligaments and muscle tissue around the rectum, bladder, and uterus are damaged or weakened.  CAUSES  This happens especially with:  Childbirth. Some women feel pelvic pressure or have trouble holding their urine right after childbirth, because of stretching and tearing of pelvic tissues. This generally gets better with time and the feeling usually goes away, but it may return with aging.   Chronic heavy lifting.   Aging.   Menopause, with loss of estrogen production weakening the pelvic ligaments and muscles.   Past pelvic surgery.   Obesity.   Chronic constipation.   Chronic cough.  Prolapse may affect a single organ, or several organs may prolapse at the same time. The front wall of the vagina holds up the bladder. The back wall holds up part of the lower intestine, or rectum. The uterus fills a spot in the middle. All these organs can be involved when the ligaments and muscles around the vagina relax too much. This often gets worse when women stop producing estrogen (menopause). SYMPTOMS  Uncontrolled loss of urine (incontinence) with cough, sneeze, straining, and exercise.   More force may be required to have a bowel movement, due to trapping of the stool.   When part of an organ bulges through the opening of the vagina, there is sometimes a feeling of heaviness or pressure. It may feel as though something is falling out. This sensation increases with coughing or bearing down.   If the organs protrude through the opening of the vagina and rub against the clothing, there may be soreness, ulcers, infection, pain, and bleeding.   Lower back pain.   Pushing in the upper or lower part of the vagina, to pass urine or have a bowel movement.     Problems having sexual intercourse.   Being unable to insert a tampon or applicator.  DIAGNOSIS  Usually, a physical exam is all that is needed to identify the problem. During the examination, you may be asked to cough and strain while lying down, sitting up, and standing up. Your caregiver will determine if more testing is required, such as bladder function tests. Some diagnoses are:  Cystocele: Bulging and falling of the bladder into the top of the vagina.   Rectocele: Part of the rectum bulging into the vagina.   Prolapse of the uterus: The uterus falls or drops into the vagina.   Enterocele: Bulging of the top of the vagina, after a hysterectomy (uterus removal), with the small intestine bulging into the vagina. A hernia in the top of the vagina.   Urethrocele: The urethra (urine carrying tube) bulging into the vagina.  TREATMENT  In most cases, prolapse needs to be treated only if it produces symptoms. If the symptoms are interfering with your usual daily or sexual activities, treatment may be necessary. The following are some measures that may be used to treat prolapse.  Estrogen may help elderly women with mild prolapse.   Kegel exercises may help mild cases of prolapse, by strengthening and tightening the muscles of the pelvic floor.   Pessaries are used in women who choose not to, or are unable to, have surgery. A pessary is a doughnut-shaped piece of plastic or rubber that is put into the vagina to keep the organs in place. This device must   be fitted by your caregiver. Your caregiver will also explain how to care for yourself with the pessary. If it works well for you, this may be the only treatment required.   Surgery is often the only form of treatment for more severe prolapses. There are different types of surgery available. You should discuss what the best procedure is for you. If the uterus is prolapsed, it may be removed (hysterectomy) as part of the surgical treatment.  Your caregiver will discuss the risks and benefits with you.   Uterine-vaginal suspension (surgery to hold up the organs) may be used, especially if you want to maintain your fertility.  No form of treatment is guaranteed to correct the prolapse or relieve the symptoms. HOME CARE INSTRUCTIONS   Wear a sanitary pad or absorbent product if you have incontinence of urine.   Avoid heavy lifting and straining with exercise and work.   Take over-the-counter pain medicine for minor discomfort.   Try taking estrogen or using estrogen vaginal cream.   Try Kegel exercises or use a pessary, before deciding to have surgery.   Do Kegel exercises after having a baby.  SEEK MEDICAL CARE IF:   Your symptoms interfere with your daily activities.   You need medicine to help with the discomfort.   You need to be fitted with a pessary.   You notice bleeding from the vagina.   You think you have ulcers or you notice ulcers on the cervix.   You have an oral temperature above 102 F (38.9 C).   You develop pain or blood with urination.   You have bleeding with a bowel movement.   The symptoms are interfering with your sex life.   You have urinary incontinence that interferes with your daily activities.   You lose urine with sexual intercourse.   You have a chronic cough.   You have chronic constipation.  Document Released: 02/01/2003 Document Revised: 07/17/2011 Document Reviewed: 08/12/2009 Exit[employer] Patient Information 2012 [employer]. 

## 2011-12-09 NOTE — Progress Notes (Signed)
Patient ID: Deborah Conner, female   DOB: February 01, 1951, 61 y.o.   MRN: [id number] [name] is a 61 y.o. year old female,No obstetric history on file., who presents for a problem visit.  Subjective:  The patient presents complaining of an episode of vaginal bleeding.  She has uterine and cervix prolapse.  She reports that she was working in her yard and noticed that her cervix was prolapsing.  She used moist tissue to push the cervix back inside.  She reports 1 episode of bleeding after that.  She does have pelvic pressure symptoms particularly when she is active.  Objective:  BP 124/70  Wt 172 lb (78.019 kg)   General: alert and cooperative GI: soft, non-tender; bowel sounds normal; no masses,  no organomegaly  External genitalia: normal general appearance and atrophic. Vaginal: atrophic changes, relaxation noted Cervix: no lesions or bleeding is seen.  The cervix prolapses to within 1 cm of the introitus. Adnexa: normal bimanual exam Uterus: upper limits normal size Kegel exercises practiced.  Assessment:  Vaginal bleeding due to irritation of the cervix.  No lesions noted today.  Pelvic relaxation.  Uterine prolapse.  Plan:  We discussed the management of uterine prolapse and vaginal bleeding.  Kegel exercises recommended.  The proper use, benefits, and risks of the pessary were outlined.  The patient was given literature at repeat.  She will consider her options.  The patient will try olive oil to the vulva if she notices irritation.  Return to office in August 2013 for annual exam or as needed.  Call if bleeding returns.  prn if symptoms worsen or fail to improve.   Leonard Schwartz M.D.  12/09/2011 7:07 PM

## 2012-05-25 ENCOUNTER — Ambulatory Visit (INDEPENDENT_AMBULATORY_CARE_PROVIDER_SITE_OTHER): Payer: BC Managed Care – PPO | Admitting: Obstetrics and Gynecology

## 2012-05-25 ENCOUNTER — Encounter: Payer: Self-pay | Admitting: Obstetrics and Gynecology

## 2012-05-25 VITALS — BP 124/70 | Ht 65.0 in | Wt 164.0 lb

## 2012-05-25 DIAGNOSIS — Z124 Encounter for screening for malignant neoplasm of cervix: Secondary | ICD-10-CM

## 2012-05-25 DIAGNOSIS — Z01419 Encounter for gynecological examination (general) (routine) without abnormal findings: Secondary | ICD-10-CM

## 2012-05-25 NOTE — Progress Notes (Signed)
Subjective:    Deborah Conner is a 61 y.o. female G2P2 who presents for annual exam. The patient has some stress caring for her husband who has seizures.  She is now retired.  She has a history of uterine prolapse.  She has a history of osteoporosis.  Her last bone density scan was in 2012.  The following portions of the patient's history were reviewed and updated as appropriate: allergies, current medications, past family history, past medical history, past social history, past surgical history and problem list.  Review of Systems Pertinent items are noted in HPI. Gastrointestinal:No change in bowel habits, no abdominal pain, no rectal bleeding Genitourinary:negative for dysuria, frequency, hematuria, nocturia and urinary incontinence    Objective:     BP 124/70  Ht 5\' 5"  (1.651 m)  Wt 164 lb (74.39 kg)  BMI 27.29 kg/m2  Weight:  Wt Readings from Last 1 Encounters:  05/25/12 164 lb (74.39 kg)     BMI: Body mass index is 27.29 kg/(m^2). General Appearance: Alert, appropriate appearance for age. No acute distress HEENT: Grossly normal Neck / Thyroid: Supple, no masses, nodes or enlargement Lungs: clear to auscultation bilaterally Back: No CVA tenderness Breast Exam: No masses or nodes.No dimpling, nipple retraction or discharge. Cardiovascular: Regular rate and rhythm. S1, S2, no murmur Gastrointestinal: Soft, non-tender, no masses or organomegaly  ++++++++++++++++++++++++++++++++++++++++++++++++++++++++  Pelvic Exam: External genitalia: normal general appearance Vaginal: normal without tenderness, induration or masses and relaxation noted with cystocele and rectocele Cervix: normal appearance and this since two thirds the length of the vagina Adnexa: normal bimanual exam Uterus: normal size shape and consistency, nontender Rectovaginal: normal rectal, no masses  ++++++++++++++++++++++++++++++++++++++++++++++++++++++++  Lymphatic Exam: Non-palpable nodes in neck,  clavicular, axillary, or inguinal regions  Psychiatric: Alert and oriented, appropriate affect.@OBJECTIVEEN @      Assessment:    pelvic relaxation with uterine prolapse   Overweight or obese: No  Pelvic relaxation: Yes  Menopausal symptoms: Yes. Severe: No.  osteoporosis   Plan:    Mammogram. Pap smear.   Follow-up:  for annual exam  The updated Pap smear screening guidelines were discussed with the patient. The patient requested that I obtain a Pap smear: Yes.  Kegel exercises discussed: Yes.  Proper diet and regular exercise were reviewed.  Annual mammograms recommended starting at age 60. Proper breast care was discussed.  Screening colonoscopy is recommended beginning at age 68.  Regular health maintenance was reviewed.  Sleep hygiene was discussed.  Adequate calcium and vitamin D intake was emphasized.  Her primary physician is managing her bone health  Leonard Schwartz M.D.   Regular Periods: no Mammogram: yes  Monthly Breast Ex.: yes Exercise: yes  Tetanus < 10 years: yes Seatbelts: yes  NI. Bladder Functn.: yes Abuse at home: no  Daily BM's: yes Stressful Work: no  Healthy Diet: yes Sigmoid-Colonoscopy: 04/2011  Calcium: no Medical problems this year: NONE   LAST PAP:01/24/2009  Contraception: Post-Menopause  Mammogram:  09/2011  PCP: Duaine Dredge  PMH: none  FMH: none  Last Bone Scan: 2012 at PCP

## 2012-09-10 ENCOUNTER — Other Ambulatory Visit: Payer: Self-pay | Admitting: Obstetrics and Gynecology

## 2012-09-10 DIAGNOSIS — Z1231 Encounter for screening mammogram for malignant neoplasm of breast: Secondary | ICD-10-CM

## 2012-10-19 ENCOUNTER — Ambulatory Visit
Admission: RE | Admit: 2012-10-19 | Discharge: 2012-10-19 | Disposition: A | Discharge status: Home / Self Care | Payer: BC Managed Care – PPO | Source: Ambulatory Visit | Attending: Obstetrics and Gynecology | Admitting: Obstetrics and Gynecology

## 2012-10-19 DIAGNOSIS — Z1231 Encounter for screening mammogram for malignant neoplasm of breast: Secondary | ICD-10-CM

## 2013-06-07 ENCOUNTER — Encounter: Payer: Self-pay | Admitting: Internal Medicine

## 2013-06-07 ENCOUNTER — Ambulatory Visit (INDEPENDENT_AMBULATORY_CARE_PROVIDER_SITE_OTHER): Payer: BC Managed Care – PPO | Admitting: Internal Medicine

## 2013-06-07 VITALS — BP 118/74 | HR 76 | Ht 65.0 in | Wt 149.5 lb

## 2013-06-07 DIAGNOSIS — K589 Irritable bowel syndrome without diarrhea: Secondary | ICD-10-CM

## 2013-06-07 NOTE — Progress Notes (Signed)
  Subjective:    Patient ID: Deborah Conner, female    DOB: [birth year], 62 y.o.   MRN: [id number]  HPI Ms. Santerre has IBS and is here with c/o several stools in one day that occur about every 2 weeks. Not necessarily a new pattern - she wants to tell me about it and see if any other testing or treatment are needed. She has been intentionally losing weight and is hopeful she will be able to stop oral diabetic agents (metformin) soon. She has used loperamide successfully, hyoscyamine has not helped with episodes of lower abdominal cramps, bloating and 3-4 loose stools. She cannot pinpoint any particular food triggers and is avoiding lactose. She has been using Benefiber - restarted it in past few months. Medications, allergies, past medical history, past surgical history, family history and social history are reviewed and updated in the EMR.   Review of Systems As per HPI - some stress w/ recent hospitalization of husband for seizures and elevated BP.    Objective:   Physical Exam General:  NAD Eyes:   anicteric Abdomen:  soft and nontender, BS+, no HSM, mass    Assessment & Plan:  IBS (irritable bowel syndrome)

## 2013-06-07 NOTE — Patient Instructions (Addendum)
Please purchase align and take one daily, coupon provided today.  Try this for a month.  You may stop your benefiber.  Follow up with [place] as needed.  I appreciate the opportunity to care for you.

## 2013-06-07 NOTE — Assessment & Plan Note (Signed)
Stop benefiber Try align qd x 1 month + Reduce metformin - per Dr. Duaine Dredge Prn loperamide RTC as needed

## 2013-09-26 ENCOUNTER — Other Ambulatory Visit: Payer: Self-pay

## 2013-09-26 DIAGNOSIS — Z1231 Encounter for screening mammogram for malignant neoplasm of breast: Secondary | ICD-10-CM

## 2013-10-20 ENCOUNTER — Ambulatory Visit
Admission: RE | Admit: 2013-10-20 | Discharge: 2013-10-20 | Disposition: A | Discharge status: Home / Self Care | Payer: Medicare Other | Source: Ambulatory Visit

## 2013-10-20 DIAGNOSIS — Z1231 Encounter for screening mammogram for malignant neoplasm of breast: Secondary | ICD-10-CM

## 2013-11-01 ENCOUNTER — Telehealth: Payer: Self-pay | Admitting: Internal Medicine

## 2013-11-01 MED ORDER — DICYCLOMINE HCL 20 MG PO TABS: 20.0000 mg | ORAL_TABLET | Freq: Four times a day (QID) | ORAL | Status: DC

## 2013-11-01 NOTE — Telephone Encounter (Signed)
Dicyclomine 20 mg tabs  1/2-1 every 6 hrs as needed  # 90 1 refill

## 2013-11-01 NOTE — Telephone Encounter (Signed)
Patient reports a few weeks of lower abdominal cramping and diarrhea.  She has tried hyoscyamine in the past, but this has not helped.  She was seen by her GYN yesterday, because a she thought her pessary was causing some of her problems, but he thought there was no problem with the pessary.   She states that imodium has helped.  Is there an alternative antispasmodic she can try?

## 2013-11-01 NOTE — Telephone Encounter (Signed)
I have left a message for her to call back

## 2013-11-01 NOTE — Telephone Encounter (Signed)
Patient notified Rx sent

## 2013-12-02 ENCOUNTER — Ambulatory Visit
Admission: RE | Admit: 2013-12-02 | Discharge: 2013-12-02 | Disposition: A | Discharge status: Home / Self Care | Payer: Medicare Other | Source: Ambulatory Visit | Attending: Family Medicine | Admitting: Family Medicine

## 2013-12-02 ENCOUNTER — Other Ambulatory Visit: Payer: Self-pay | Admitting: Family Medicine

## 2013-12-02 DIAGNOSIS — R0789 Other chest pain: Secondary | ICD-10-CM

## 2014-04-10 ENCOUNTER — Encounter: Payer: Self-pay | Admitting: Podiatry

## 2014-04-10 ENCOUNTER — Ambulatory Visit (INDEPENDENT_AMBULATORY_CARE_PROVIDER_SITE_OTHER): Payer: Medicare Other | Admitting: Podiatry

## 2014-04-10 VITALS — BP 135/83 | HR 74 | Resp 16 | Ht 66.0 in | Wt 145.0 lb

## 2014-04-10 DIAGNOSIS — L600 Ingrowing nail: Secondary | ICD-10-CM | POA: Diagnosis not present

## 2014-04-10 NOTE — Progress Notes (Signed)
   Subjective:    Patient ID: Deborah Conner, female    DOB: February 07, 1951, 63 y.o.   MRN: [id number]  HPI Comments: i have an ingrown toenail on my right big toe and i have fungus. The big toenail on my foot does hurt. The nail has remained the same. A lot of walking will bother the nail especially at night time. i put alcohol on my toe, cotton and bandage on it. i have soaked in epsom salt and i trim my toenails.     Review of Systems  Constitutional: Positive for unexpected weight change.  HENT: Positive for sinus pressure.        Ringing in ears   Musculoskeletal:       Joint pain  All other systems reviewed and are negative.      Objective:   Physical Exam  Orientated x3 black female  Vascular: DP and PT pulses 2/4 bilaterally Capillary reflex immediate bilaterally Feet are warm to touch bilaterally  Neurological: Sensation to 10 g monofilament wire intact 5/5 bilaterally Vibratory sensation intact bilaterally Ankle reflex equal and reactive bilaterally  Dermatological: Texture and turgor within normal limits bilaterally The lateral margin the right hallux toenails incurvated with low-grade edema , with a callused nail groove Lateral margin is tender to palpation the du Page area of discomfort  Musculoskeletal: HAV deformities bilaterally      Assessment & Plan:   Assessment: Satisfactory neurovascular status Ingrowing lateral margin of the right hallux toenail  Plan: Offered patient permanent removal the lateral margin of the right hallux toenail. Patient verbally consents  The right hallux is en bloc with 3 cc 50-50 mixture of 2% plain Xylocaine and 0.5% plain Marcaine. The hallux is prepped with Betadine and exsanguinated. The lateral margin the right hallux toenail was excised and a phenol matricectomy performed. An antibiotic compression dressing was applied. The tourniquet was released and spontaneous capillary filling times noted in the right  hallux.  Patient tolerated procedure without any difficulty. Postoperative oral reconstruction provided.  Reappoint x7 days

## 2014-04-10 NOTE — Patient Instructions (Signed)

## 2014-04-11 ENCOUNTER — Encounter: Payer: Self-pay | Admitting: Podiatry

## 2014-04-19 ENCOUNTER — Ambulatory Visit (INDEPENDENT_AMBULATORY_CARE_PROVIDER_SITE_OTHER): Payer: Medicare Other | Admitting: Podiatry

## 2014-04-19 VITALS — BP 134/76 | HR 73 | Resp 12

## 2014-04-19 DIAGNOSIS — L600 Ingrowing nail: Secondary | ICD-10-CM

## 2014-04-20 NOTE — Progress Notes (Signed)
Patient ID: Deborah Conner, female   DOB: [birth year], 63 y.o.   MRN: [id number]  Subjective:  this patient presents postop phenol matricectomy 04/10/2014  Objective: Lateral margin the right hallux toenails narrowed with slight edema, no erythema or active drainage noted  Assessment: Satisfactory appearance of operative site without a clinical sign of infection  Plan: Patient will continue to apply topical antibiotic ointment and Band-Aid the surgical site until healed.  Patient is discharged with instructions to contact if she has any future concerns

## 2014-06-12 ENCOUNTER — Encounter: Payer: Self-pay | Admitting: Podiatry

## 2014-09-26 ENCOUNTER — Other Ambulatory Visit: Payer: Self-pay

## 2014-09-26 DIAGNOSIS — Z1231 Encounter for screening mammogram for malignant neoplasm of breast: Secondary | ICD-10-CM

## 2014-10-26 ENCOUNTER — Ambulatory Visit
Admission: RE | Admit: 2014-10-26 | Discharge: 2014-10-26 | Disposition: A | Discharge status: Home / Self Care | Payer: BC Managed Care – PPO | Source: Ambulatory Visit

## 2014-10-26 DIAGNOSIS — Z1231 Encounter for screening mammogram for malignant neoplasm of breast: Secondary | ICD-10-CM

## 2015-06-01 ENCOUNTER — Telehealth: Payer: Self-pay | Admitting: Internal Medicine

## 2015-06-01 NOTE — Telephone Encounter (Signed)
Patient notified that VSL 3 is superior to Align according to [employer] PA She will call back for any additional questions or concerns

## 2015-07-26 DIAGNOSIS — L219 Seborrheic dermatitis, unspecified: Secondary | ICD-10-CM | POA: Insufficient documentation

## 2015-09-24 ENCOUNTER — Other Ambulatory Visit: Payer: Self-pay

## 2015-09-24 DIAGNOSIS — Z1231 Encounter for screening mammogram for malignant neoplasm of breast: Secondary | ICD-10-CM

## 2015-10-31 ENCOUNTER — Ambulatory Visit: Payer: Medicare Other

## 2015-11-05 ENCOUNTER — Ambulatory Visit
Admission: RE | Admit: 2015-11-05 | Discharge: 2015-11-05 | Disposition: A | Discharge status: Home / Self Care | Payer: Medicare Other | Source: Ambulatory Visit

## 2015-11-05 DIAGNOSIS — Z1231 Encounter for screening mammogram for malignant neoplasm of breast: Secondary | ICD-10-CM

## 2016-03-05 ENCOUNTER — Encounter: Payer: Self-pay | Admitting: Internal Medicine

## 2016-03-06 DIAGNOSIS — H60543 Acute eczematoid otitis externa, bilateral: Secondary | ICD-10-CM | POA: Insufficient documentation

## 2016-04-08 ENCOUNTER — Ambulatory Visit (AMBULATORY_SURGERY_CENTER): Payer: Self-pay

## 2016-04-08 VITALS — Ht 66.0 in | Wt 155.0 lb

## 2016-04-08 DIAGNOSIS — Z860100 Personal history of colon polyps, unspecified: Secondary | ICD-10-CM

## 2016-04-08 NOTE — Progress Notes (Signed)
No allergies to eggs or soy No past problems with anesthesia No diet meds No home oxygen  Declined emmi 

## 2016-04-18 ENCOUNTER — Telehealth: Payer: Self-pay | Admitting: Internal Medicine

## 2016-04-18 NOTE — Telephone Encounter (Signed)
Pt had questions about deep sedation vs. Moderate sedation. Pt's questions answered

## 2016-04-23 ENCOUNTER — Encounter: Payer: Self-pay | Admitting: Internal Medicine

## 2016-04-23 ENCOUNTER — Ambulatory Visit (AMBULATORY_SURGERY_CENTER): Payer: Medicare Other | Admitting: Internal Medicine

## 2016-04-23 VITALS — BP 129/75 | HR 64 | Temp 98.0°F | Resp 16 | Ht 66.0 in | Wt 155.0 lb

## 2016-04-23 DIAGNOSIS — Z86010 Personal history of colonic polyps: Secondary | ICD-10-CM | POA: Diagnosis not present

## 2016-04-23 MED ORDER — SODIUM CHLORIDE 0.9 % IV SOLN: 500.0000 mL | INTRAVENOUS | Status: DC

## 2016-04-23 NOTE — Op Note (Signed)
[place] Patient Name: Deborah Conner Procedure Date: 04/23/2016 8:48 AM MRN: [id number] Endoscopist: Gatha Mayer , MD Age: 65 Referring MD:  Date of Birth: June 16, 1951 Gender: Female Account #: [id number] Procedure:                Colonoscopy Indications:              High risk colon cancer surveillance: Personal                            history of colonic polyps Medicines:                Propofol per Anesthesia, Monitored Anesthesia Care Procedure:                Pre-Anesthesia Assessment:                           - Prior to the procedure, a History and Physical                            was performed, and patient medications and                            allergies were reviewed. The patient's tolerance of                            previous anesthesia was also reviewed. The risks                            and benefits of the procedure and the sedation                            options and risks were discussed with the patient.                            All questions were answered, and informed consent                            was obtained. Prior Anticoagulants: The patient has                            taken no previous anticoagulant or antiplatelet                            agents. ASA Grade Assessment: II - A patient with                            mild systemic disease. After reviewing the risks                            and benefits, the patient was deemed in                            satisfactory condition to undergo the procedure.  After obtaining informed consent, the colonoscope                            was passed under direct vision. Throughout the                            procedure, the patient's blood pressure, pulse, and                            oxygen saturations were monitored continuously. The                            Model CF-HQ190L [id number]) scope was introduced                            through  the anus and advanced to the the cecum,                            identified by appendiceal orifice and ileocecal                            valve. The ileocecal valve, appendiceal orifice,                            and rectum were photographed. The quality of the                            bowel preparation was excellent. The bowel                            preparation used was Miralax. Scope In: 9:04:18 AM Scope Out: 9:21:52 AM Scope Withdrawal Time: 0 hours 11 minutes 2 seconds  Total Procedure Duration: 0 hours 17 minutes 34 seconds  Findings:                 The perianal and digital rectal examinations were                            normal.                           Diverticula were found in the sigmoid colon and                            ascending colon.                           The exam was otherwise without abnormality on                            direct and retroflexion views. Complications:            No immediate complications. Estimated blood loss:                            None. Estimated Blood Loss:  Estimated blood loss: none. Impression:               - Severe diverticulosis in the sigmoid colon and                            mild in the ascending colon.                           - The examination was otherwise normal on direct                            and retroflexion views.                           - No specimens collected.                           - Personal history of colonic polyps. Dominutive                            adenoma 2012 so revert to q 10 yr exam Recommendation:           - Repeat colonoscopy in 10 years for surveillance.                           - Resume previous diet.                           - Continue present medications. Gatha Mayer, MD 04/23/2016 9:29:30 AM This report has been signed electronically.

## 2016-04-23 NOTE — Patient Instructions (Addendum)
   No polyps today!  Next routine colonoscopy in 10 years - 2027  I appreciate the opportunity to care for you. Gatha Mayer, MD, FACG   YOU HAD AN ENDOSCOPIC PROCEDURE TODAY AT [facility]:   Refer to the procedure report that was given to you for any specific questions about what was found during the examination.  If the procedure report does not answer your questions, please call your gastroenterologist to clarify.  If you requested that your care partner not be given the details of your procedure findings, then the procedure report has been included in a sealed envelope for you to review at your convenience later.  YOU SHOULD EXPECT: Some feelings of bloating in the abdomen. Passage of more gas than usual.  Walking can help get rid of the air that was put into your GI tract during the procedure and reduce the bloating. If you had a lower endoscopy (such as a colonoscopy or flexible sigmoidoscopy) you may notice spotting of blood in your stool or on the toilet paper. If you underwent a bowel prep for your procedure, you may not have a normal bowel movement for a few days.  Please Note:  You might notice some irritation and congestion in your nose or some drainage.  This is from the oxygen used during your procedure.  There is no need for concern and it should clear up in a day or so.  SYMPTOMS TO REPORT IMMEDIATELY:   Following lower endoscopy (colonoscopy or flexible sigmoidoscopy):  Excessive amounts of blood in the stool  Significant tenderness or worsening of abdominal pains  Swelling of the abdomen that is new, acute  Fever of 100F or higher   For urgent or emergent issues, a gastroenterologist can be reached at any hour by calling [phone].   DIET:  We do recommend a small meal at first, but then you may proceed to your regular diet.  Drink plenty of fluids but you should avoid alcoholic beverages for 24 hours.  ACTIVITY:  You should plan to take  it easy for the rest of today and you should NOT DRIVE or use heavy machinery until tomorrow (because of the sedation medicines used during the test).    FOLLOW UP: Our staff will call the number listed on your records the next business day following your procedure to check on you and address any questions or concerns that you may have regarding the information given to you following your procedure. If we do not reach you, we will leave a message.  However, if you are feeling well and you are not experiencing any problems, there is no need to return our call.  We will assume that you have returned to your regular daily activities without incident.  If any biopsies were taken you will be contacted by phone or by letter within the next 1-3 weeks.  Please call us at [phone] if you have not heard about the biopsies in 3 weeks.    SIGNATURES/CONFIDENTIALITY: You and/or your care partner have signed paperwork which will be entered into your electronic medical record.  These signatures attest to the fact that that the information above on your After Visit Summary has been reviewed and is understood.  Full responsibility of the confidentiality of this discharge information lies with you and/or your care-partner.  Diverticulosis-handout given  Repeat colonoscopy in 10 years 2027.

## 2016-04-23 NOTE — Progress Notes (Signed)
To PACU  Awake and alert.  Report to RN 

## 2016-04-24 ENCOUNTER — Telehealth: Payer: Self-pay | Admitting: *Deleted

## 2016-04-24 NOTE — Telephone Encounter (Signed)
Left message on f/u call 

## 2016-07-31 ENCOUNTER — Ambulatory Visit
Admission: RE | Admit: 2016-07-31 | Discharge: 2016-07-31 | Disposition: A | Discharge status: Home / Self Care | Payer: Medicare Other | Source: Ambulatory Visit | Attending: Family Medicine | Admitting: Family Medicine

## 2016-07-31 ENCOUNTER — Other Ambulatory Visit: Payer: Self-pay | Admitting: Family Medicine

## 2016-07-31 DIAGNOSIS — M25561 Pain in right knee: Secondary | ICD-10-CM

## 2016-10-03 ENCOUNTER — Other Ambulatory Visit: Payer: Self-pay | Admitting: Family Medicine

## 2016-10-03 DIAGNOSIS — Z1231 Encounter for screening mammogram for malignant neoplasm of breast: Secondary | ICD-10-CM

## 2016-10-31 ENCOUNTER — Ambulatory Visit
Admission: RE | Admit: 2016-10-31 | Discharge: 2016-10-31 | Disposition: A | Discharge status: Home / Self Care | Payer: Medicare Other | Source: Ambulatory Visit | Attending: Family Medicine | Admitting: Family Medicine

## 2016-10-31 ENCOUNTER — Other Ambulatory Visit: Payer: Self-pay | Admitting: Family Medicine

## 2016-10-31 DIAGNOSIS — M25551 Pain in right hip: Secondary | ICD-10-CM

## 2016-11-06 ENCOUNTER — Ambulatory Visit
Admission: RE | Admit: 2016-11-06 | Discharge: 2016-11-06 | Disposition: A | Discharge status: Home / Self Care | Payer: Medicare Other | Source: Ambulatory Visit | Attending: Family Medicine | Admitting: Family Medicine

## 2016-11-06 DIAGNOSIS — Z1231 Encounter for screening mammogram for malignant neoplasm of breast: Secondary | ICD-10-CM

## 2016-11-12 ENCOUNTER — Encounter (INDEPENDENT_AMBULATORY_CARE_PROVIDER_SITE_OTHER): Payer: Self-pay | Admitting: Orthopedic Surgery

## 2016-11-12 ENCOUNTER — Ambulatory Visit (INDEPENDENT_AMBULATORY_CARE_PROVIDER_SITE_OTHER): Payer: Medicare Other | Admitting: Family

## 2016-11-12 VITALS — Ht 66.0 in | Wt 155.0 lb

## 2016-11-12 DIAGNOSIS — M1711 Unilateral primary osteoarthritis, right knee: Secondary | ICD-10-CM

## 2016-11-12 NOTE — Progress Notes (Signed)
Office Visit Note   Patient: Deborah Conner           Date of Birth: 08-02-1951           MRN: [id number] Visit Date: 11/12/2016              Requested by: Derinda Late, MD [address] [place], [place] [address] PCP: Marylene Land, MD  Chief Complaint  Patient presents with  . Right Knee - Pain  . Right Hip - Pain      HPI: 9Patient is a 66 year old woman who presents today for evaluation of right knee pain. Does complain of mild pain to right hip as well. Laterally. She follows with Dr. Sandi Mariscal as well as her primary care provider. He has been following her for the same. She complains of tightness in her knee when she is walking she feels like she might have pulled something in her medial knee. She has been having worse pain going up and down stairs. Feels on comfortable if she lays on her side. Denies any groin pain no knee swelling.  Assessment & Plan: Visit Diagnoses: No diagnosis found.  Plan: Follow up in office in 4 weeks to re eval. Depomedrol injection today. Continue with Ibu as needed for pain. May use ice.   Follow-Up Instructions: No Follow-up on file.   Right Knee Exam   Tenderness  The patient is experiencing tenderness in the medial joint line.  Range of Motion  The patient has normal right knee ROM.  Tests  Varus: negative Valgus: negative  Other  Erythema: absent Swelling: none   Right Hip Exam   Tenderness  The patient is experiencing no tenderness.     Range of Motion  The patient has normal right hip ROM.  Tests  FABER: negative      Patient is alert, oriented, no adenopathy, well-dressed, normal affect, normal respiratory effort. Steady gait.  Imaging: No results found.  Labs: Lab Results  Component Value Date   HGBA1C 6.0 10/03/2010    Orders:  No orders of the defined types were placed in this encounter.  No orders of the defined types were placed in this encounter.    Procedures: Large Joint  Inj Date/Time: 11/12/2016 4:29 PM Performed by: Suzan Slick Authorized by: Dondra Prader R   Consent Given by:  Patient Site marked: the procedure site was marked   Timeout: prior to procedure the correct patient, procedure, and site was verified   Indications:  Pain and diagnostic evaluation Location:  Knee Site:  R knee Needle Size:  22 G Needle Length:  1.5 inches Ultrasound Guidance: No   Fluoroscopic Guidance: No   Arthrogram: No   Medications:  5 mL lidocaine 1 %; 40 mg methylPREDNISolone acetate 40 MG/ML Aspiration Attempted: No   Patient tolerance:  Patient tolerated the procedure well with no immediate complications    Clinical Data: No additional findings.  ROS:  All other systems negative, except as noted in the HPI. Review of Systems  Constitutional: Negative for chills and fever.  Musculoskeletal: Positive for arthralgias. Negative for joint swelling.    Objective: Vital Signs: Ht 5\' 6"  (1.676 m)   Wt 155 lb (70.3 kg)   BMI 25.02 kg/m   Specialty Comments:  No specialty comments available.  PMFS History: Patient Active Problem List   Diagnosis Date Noted  . Uterine prolapse 12/09/2011  . Menopausal symptoms 12/09/2011  . Vaginal bleeding 12/09/2011  . Pelvic pressure in female 12/09/2011  .  Overweight(278.02) 12/09/2011  . Osteoporosis 12/09/2011  . Other dysphagia 02/24/2011  . Unspecified hemorrhoids with other complication 24/58/0998  . IBS (irritable bowel syndrome) 02/24/2011  . Lactose intolerance 02/24/2011   Past Medical History:  Diagnosis Date  . Anal fissure   . Anxiety and depression 1998/1973   some panic  . Aortic sclerosis 2005  . Diverticulosis 2003  . GERD (gastroesophageal reflux disease) 2001  . Gestational diabetes   . Glucose intolerance (impaired glucose tolerance) 2000  . Heart murmur   . HLD (hyperlipidemia)   . IBS (irritable bowel syndrome) 1998  . Insomnia 2003  . Internal hemorrhoids   . Lactose  intolerance 1998  . Migraine headache childhood  . Osteoporosis 2010  . Polyarthralgia   . Rosacea 1975  . Seborrheic dermatitis age 75  . Tinnitus 1999    Family History  Problem Relation Age of Onset  . Hypertension Mother     half-sister  . Heart disease Mother     CHF  . Gout Mother   . Osteoporosis Mother   . Hearing loss Father   . Diabetes Sister   . Arthritis Sister   . Raynaud syndrome Daughter   . Colon cancer Neg Hx     Past Surgical History:  Procedure Laterality Date  . COLONOSCOPY  2003, 04/11/2011   20012: diminutive polyp, diverticulosis, internal hemorrhoids  . UPPER GASTROINTESTINAL ENDOSCOPY  01/30/2006, 04/11/2011   2007 and 2012:Normal    Social History   Occupational History  . [employer] History Main Topics  . Smoking status: Never Smoker  . Smokeless tobacco: Never Used  . Alcohol use No     Comment: rarely  . Drug use: No  . Sexual activity: Yes    Birth control/ protection: Post-menopausal

## 2016-11-17 MED ORDER — METHYLPREDNISOLONE ACETATE 40 MG/ML IJ SUSP
40.0000 mg | INTRAMUSCULAR | Status: AC | PRN
  Administered 2016-11-12: 40 mg via INTRA_ARTICULAR

## 2016-11-17 MED ORDER — LIDOCAINE HCL 1 % IJ SOLN
5.0000 mL | INTRAMUSCULAR | Status: AC | PRN
  Administered 2016-11-12: 5 mL

## 2016-12-10 ENCOUNTER — Ambulatory Visit (INDEPENDENT_AMBULATORY_CARE_PROVIDER_SITE_OTHER): Payer: Medicare Other | Admitting: Family

## 2016-12-10 ENCOUNTER — Ambulatory Visit (INDEPENDENT_AMBULATORY_CARE_PROVIDER_SITE_OTHER): Payer: Medicare Other | Admitting: Orthopedic Surgery

## 2016-12-12 ENCOUNTER — Encounter (INDEPENDENT_AMBULATORY_CARE_PROVIDER_SITE_OTHER): Payer: Self-pay | Admitting: Orthopedic Surgery

## 2016-12-12 ENCOUNTER — Ambulatory Visit (INDEPENDENT_AMBULATORY_CARE_PROVIDER_SITE_OTHER): Payer: Medicare Other | Admitting: Orthopedic Surgery

## 2016-12-12 DIAGNOSIS — M1711 Unilateral primary osteoarthritis, right knee: Secondary | ICD-10-CM

## 2016-12-12 MED ORDER — METHYLPREDNISOLONE ACETATE 40 MG/ML IJ SUSP
40.0000 mg | INTRAMUSCULAR | Status: AC | PRN
  Administered 2016-12-12: 40 mg via INTRA_ARTICULAR

## 2016-12-12 MED ORDER — LIDOCAINE HCL 1 % IJ SOLN
5.0000 mL | INTRAMUSCULAR | Status: AC | PRN
  Administered 2016-12-12: 5 mL

## 2016-12-12 NOTE — Progress Notes (Signed)
Office Visit Note   Patient: Deborah Conner           Date of Birth: 09/25/1950           MRN: [id number] Visit Date: 12/12/2016              Requested by: Derinda Late, MD [address] [place], [place] [address] PCP: Marylene Land, MD  Chief Complaint  Patient presents with  . Right Knee - Follow-up      HPI: Patient is a 66 year old woman with osteoarthritis of her right knee she states the last injection about a month ago did not really help. She complains of pain medially complains of a toothache-like throbbing hurts worse in the evening after being on her feet she states it feels worse when she tries to lay on her side. Her previous radiographs reviewed which showed no significant arthritis radiographically.  Assessment & Plan: Visit Diagnoses:  1. Unilateral primary osteoarthritis, right knee     Plan: Right knee was injected from the anterior medial portal. Discussed that if this provides temporary relief we could proceed with a hyaluronic acid injection she will call if she wants to schedule this injection.  Follow-Up Instructions: Return if symptoms worsen or fail to improve.   Ortho Exam  Patient is alert, oriented, no adenopathy, well-dressed, normal affect, normal respiratory effort. Patient has an antalgic gait. Examination there is no effusion no redness no cellulitis. She has no crepitation with range of motion she is primarily tender to palpation medial joint line closer cruciates are stable.  Imaging: No results found.  Labs: Lab Results  Component Value Date   HGBA1C 6.0 10/03/2010    Orders:  No orders of the defined types were placed in this encounter.  No orders of the defined types were placed in this encounter.    Procedures: Large Joint Inj Date/Time: 12/12/2016 9:22 AM Performed by: Sharlisa Hollifield V Authorized by: Newt Minion   Consent Given by:  Patient Site marked: the procedure site was marked   Timeout: prior to  procedure the correct patient, procedure, and site was verified   Indications:  Pain and diagnostic evaluation Location:  Knee Site:  R knee Prep: patient was prepped and draped in usual sterile fashion   Needle Size:  22 G Needle Length:  1.5 inches Approach:  Anteromedial Ultrasound Guidance: No   Fluoroscopic Guidance: No   Arthrogram: No   Medications:  5 mL lidocaine 1 %; 40 mg methylPREDNISolone acetate 40 MG/ML Aspiration Attempted: No   Patient tolerance:  Patient tolerated the procedure well with no immediate complications    Clinical Data: No additional findings.  ROS:  All other systems negative, except as noted in the HPI. Review of Systems  Objective: Vital Signs: There were no vitals taken for this visit.  Specialty Comments:  No specialty comments available.  PMFS History: Patient Active Problem List   Diagnosis Date Noted  . Uterine prolapse 12/09/2011  . Menopausal symptoms 12/09/2011  . Vaginal bleeding 12/09/2011  . Pelvic pressure in female 12/09/2011  . Overweight(278.02) 12/09/2011  . Osteoporosis 12/09/2011  . Other dysphagia 02/24/2011  . Unspecified hemorrhoids with other complication 67/67/2094  . IBS (irritable bowel syndrome) 02/24/2011  . Lactose intolerance 02/24/2011   Past Medical History:  Diagnosis Date  . Anal fissure   . Anxiety and depression 1998/1973   some panic  . Aortic sclerosis 2005  . Diverticulosis 2003  . GERD (gastroesophageal reflux disease) 2001  .  Gestational diabetes   . Glucose intolerance (impaired glucose tolerance) 2000  . Heart murmur   . HLD (hyperlipidemia)   . IBS (irritable bowel syndrome) 1998  . Insomnia 2003  . Internal hemorrhoids   . Lactose intolerance 1998  . Migraine headache childhood  . Osteoporosis 2010  . Polyarthralgia   . Rosacea 1975  . Seborrheic dermatitis age 57  . Tinnitus 1999    Family History  Problem Relation Age of Onset  . Hypertension Mother     half-sister  .  Heart disease Mother     CHF  . Gout Mother   . Osteoporosis Mother   . Hearing loss Father   . Diabetes Sister   . Arthritis Sister   . Raynaud syndrome Daughter   . Colon cancer Neg Hx     Past Surgical History:  Procedure Laterality Date  . COLONOSCOPY  2003, 04/11/2011   20012: diminutive polyp, diverticulosis, internal hemorrhoids  . UPPER GASTROINTESTINAL ENDOSCOPY  01/30/2006, 04/11/2011   2007 and 2012:Normal    Social History   Occupational History  . [employer] History Main Topics  . Smoking status: Never Smoker  . Smokeless tobacco: Never Used  . Alcohol use No     Comment: rarely  . Drug use: No  . Sexual activity: Yes    Birth control/ protection: Post-menopausal

## 2017-10-06 ENCOUNTER — Other Ambulatory Visit: Payer: Self-pay | Admitting: Family Medicine

## 2017-10-06 DIAGNOSIS — Z139 Encounter for screening, unspecified: Secondary | ICD-10-CM

## 2017-10-20 ENCOUNTER — Ambulatory Visit
Admission: RE | Admit: 2017-10-20 | Discharge: 2017-10-20 | Disposition: A | Discharge status: Home / Self Care | Payer: Medicare Other | Source: Ambulatory Visit | Attending: Family Medicine | Admitting: Family Medicine

## 2017-10-20 ENCOUNTER — Other Ambulatory Visit: Payer: Self-pay | Admitting: Family Medicine

## 2017-10-20 DIAGNOSIS — M545 Low back pain: Secondary | ICD-10-CM

## 2017-10-20 DIAGNOSIS — M25552 Pain in left hip: Secondary | ICD-10-CM

## 2017-11-09 ENCOUNTER — Ambulatory Visit
Admission: RE | Admit: 2017-11-09 | Discharge: 2017-11-09 | Disposition: A | Discharge status: Home / Self Care | Payer: Medicare Other | Source: Ambulatory Visit | Attending: Family Medicine | Admitting: Family Medicine

## 2017-11-09 DIAGNOSIS — Z139 Encounter for screening, unspecified: Secondary | ICD-10-CM

## 2018-04-15 DIAGNOSIS — L680 Hirsutism: Secondary | ICD-10-CM | POA: Insufficient documentation

## 2018-04-23 ENCOUNTER — Encounter: Payer: Self-pay | Admitting: Internal Medicine

## 2018-07-01 ENCOUNTER — Other Ambulatory Visit: Payer: Self-pay | Admitting: Family Medicine

## 2018-07-01 DIAGNOSIS — R1010 Upper abdominal pain, unspecified: Secondary | ICD-10-CM

## 2018-07-06 ENCOUNTER — Other Ambulatory Visit: Payer: Medicare Other

## 2018-07-07 ENCOUNTER — Other Ambulatory Visit: Payer: Medicare Other

## 2018-07-20 ENCOUNTER — Ambulatory Visit
Admission: RE | Admit: 2018-07-20 | Discharge: 2018-07-20 | Disposition: A | Discharge status: Home / Self Care | Payer: Medicare Other | Source: Ambulatory Visit | Attending: Family Medicine | Admitting: Family Medicine

## 2018-07-20 DIAGNOSIS — R1010 Upper abdominal pain, unspecified: Secondary | ICD-10-CM

## 2018-07-22 ENCOUNTER — Encounter: Payer: Self-pay | Admitting: Internal Medicine

## 2018-07-22 ENCOUNTER — Ambulatory Visit (INDEPENDENT_AMBULATORY_CARE_PROVIDER_SITE_OTHER): Payer: Medicare Other | Admitting: Internal Medicine

## 2018-07-22 ENCOUNTER — Other Ambulatory Visit: Payer: Medicare Other

## 2018-07-22 VITALS — BP 126/84 | HR 76 | Ht 65.0 in | Wt 164.5 lb

## 2018-07-22 DIAGNOSIS — N8189 Other female genital prolapse: Secondary | ICD-10-CM | POA: Diagnosis not present

## 2018-07-22 DIAGNOSIS — K30 Functional dyspepsia: Secondary | ICD-10-CM

## 2018-07-22 DIAGNOSIS — N814 Uterovaginal prolapse, unspecified: Secondary | ICD-10-CM | POA: Diagnosis not present

## 2018-07-22 DIAGNOSIS — K582 Mixed irritable bowel syndrome: Secondary | ICD-10-CM

## 2018-07-22 NOTE — Patient Instructions (Addendum)
If you are age 69 or older, your body mass index should be between 23-30. Your Body mass index is 27.37 kg/m. If this is out of the aforementioned range listed, please consider follow up with your Primary Care Provider.  If you are age 77 or younger, your body mass index should be between 19-25. Your Body mass index is 27.37 kg/m. If this is out of the aformentioned range listed, please consider follow up with your Primary Care Provider.   Your provider has requested that you go to the basement level for lab work before leaving today. Press "B" on the elevator. The lab is located at the first door on the left as you exit the elevator.  Stop the probiotic for 2 weeks before completing the breat test.  Start Benefiber 1 tablespoon a day.   You have been given a testing kit to check for small intestine bacterial overgrowth (SIBO) which is completed by a company named [facility]. Make sure to return your test in the mail using the return mailing label given you along with the kit. Your demographic and insurance information have already been sent to the company and they should be in contact with you over the next week regarding this test. Please keep in mind that you will be getting a call from phone number [phone] or a similar number. If you do not hear from them within this time frame, please call our office at [phone].   We will sent a referral request to physical therapy for pelvic floor weakness.   It was a pleasure to see you today!  Dr.Gessner

## 2018-07-22 NOTE — Progress Notes (Signed)
Deborah Conner 67 y.o. [birth year] [id number]  Assessment & Plan:   Encounter Diagnoses  Name Primary?  . Irritable bowel syndrome with both constipation and diarrhea Yes  . Functional dyspepsia   . Uterine prolapse   . Pelvic floor weakness in female    Try to see if I can determine other treatment to help her problems with the diagnostic work-up below   Orders Placed This Encounter  Procedures  . Helicobacter pylori antigen det, stool  . Ambulatory referral to Physical Therapy  Lactulose hydrogen breath test to evaluate for small intestinal bacterial overgrowth   Benefiber 1 tablespoon daily to see if promoting a more effective regular defecation pattern will eliminate these episodic spells  Physical therapy referral is for her pelvic floor weakness. With her uterine prolapse and the findings on rectal exam today of reduced anal squeeze and I perceived to be increased descent with simulated defecation I think things may be helped by PT and biofeedback.  Seems to be managing with the pessary though the onset of urinary symptoms does raise a question of whether or not that is continuing to be effective or will stop being as effective soon.  I would have to defer to Dr. Raphael Gibney on that but I do wonder.  Question if she is reaching the point of needing some sort of surgical intervention though I am not pushing for that.  I appreciate the opportunity to care for this patient. CC: Derinda Late, MD Dr. Ena Dawley.    Subjective:   Chief Complaint:RUQ pain  HPI The patient is here because of continued problems with irritable bowel syndrome and right upper quadrant pain problems.  The right upper quadrant pain is a postprandial pressure its not severe she can live with it but she did not want to let it go and "missed something".  She has had a lot of upper abdominal pain over the years and multiple ultrasounds had a CCK HIDA scan in 2006 and these have all been  unremarkable overall without any signs of gallstones.  Her most recent ultrasound,  07/20/2018 negative except R kidney cyst to have CT to evaluate the renal cyst. Colonoscopy 2017 diverticulosis Lower GI symptoms continue with alternating defecation, she will go for several days without much of a bowel movement and then have multiple multiple bowel movements and uses Imodium as needed at my direction a number of years ago.  Defecation can be very urgent.  Feels poorly after those days.  Then she will eventually [place] to revert to having bowel movements again.  She said Dr. Sandi Mariscal wondered if metformin might be causing it but she is really been on a stable dose for a long time.  No new medications. She continues to use probiotics.  1 cup of coffee a day.  No fecal incontinence.  Switched from VS L #3 to align and thinks that "helped some". I have records from Dr. Sandi Mariscal reviewed office notes of June 29, 2018, main focus there was her anxiety disorder, office note of April 30, 2018.  Labs from September show a negative hepatitis C antibody and a normal CBC.  Chemistries normal.  TSH normal.  Lipids normal.  Hemoglobin A1c 6.3%. Wt Readings from Last 3 Encounters:  07/22/18 164 lb 8 oz (74.6 kg)  11/12/16 155 lb (70.3 kg)  04/23/16 155 lb (70.3 kg)   She has a history of uterine prolapse managed by a pessary.  Lately is starting to have some urinary urgency with some slight  incontinence.  Dr. Raphael Gibney has manage this and in general she has not had problems.  Is due to see him later this month. Allergies  Allergen Reactions  . Sulfa Antibiotics Itching  . Sulfasalazine Itching  . Lactose Intolerance (Gi)   . Chlorthalidone Rash   Current Meds  Medication Sig  . acyclovir ointment (ZOVIRAX) 5 % as needed  . ALPRAZolam (XANAX) 0.25 MG tablet Take 0.125 mg by mouth daily.  . calcium carbonate (OSCAL) 1500 (600 Ca) MG TABS tablet Take 600 mg of elemental calcium by mouth 3 (three) times a  week.  Marland Kitchen CALCIUM-VITAMIN D PO Take 1 tablet by mouth daily.  . clobetasol ointment (TEMOVATE) 0.05 % APPLY TO SCALP EVERY OTHER DAY AS NEEDED FOR ITCHIN NOT TO FACE  . desonide (DESOWEN) 0.05 % cream apply to FACE twice a day for 2 weeks then daily if needed for itching  . FLUOCINOLONE ACETONIDE SCALP 0.01 % OIL   . FLUoxetine (PROZAC) 10 MG tablet Take 10 mg by mouth daily.  . furosemide (LASIX) 20 MG tablet Take 20 mg by mouth daily.   Marland Kitchen ketoconazole (NIZORAL) 2 % shampoo APPLY TO SCALP AND SHAMPOO THE SCALP ONCE A MOMTH  . loperamide (IMODIUM) 1 MG/5ML solution Take 1 mg by mouth as needed.   . Loperamide HCl (IMODIUM PO) Take by mouth. 1 TSP ONCE DAILY PRN  . metFORMIN (GLUCOPHAGE) 500 MG tablet Take 500 mg by mouth 2 (two) times daily with a meal.   . Multiple Vitamin (MULTIVITAMIN) tablet Take 1 tablet by mouth daily. PURE ENCAPSULATIONS  . Nutritional Supplements (CARDIO COMPLETE PO) Take by mouth. PURE ENCAPSULATIONS ONE DAILY  . OVER THE COUNTER MEDICATION PROBIOTIC VSL#3 ONE DAILY  . Polyethyl Glycol-Propyl Glycol (SYSTANE OP) Place into both eyes as needed.    . pravastatin (PRAVACHOL) 40 MG tablet Take 1 tablet by mouth daily.  Marland Kitchen PRESCRIPTION MEDICATION CREAMS FOR SEBORRHEIC DERMATITIS  . valACYclovir (VALTREX) 500 MG tablet Take 500 mg by mouth as needed.   Marland Kitchen VITAMIN D PO Take 500 mg by mouth daily.   Past Medical History:  Diagnosis Date  . Anal fissure   . Anxiety and depression 1998/1973   some panic  . Aortic sclerosis 2005  . Diverticulosis 2003  . GERD (gastroesophageal reflux disease) 2001  . Gestational diabetes   . Glucose intolerance (impaired glucose tolerance) 2000  . Heart murmur   . HLD (hyperlipidemia)   . IBS (irritable bowel syndrome) 1998  . Insomnia 2003  . Internal hemorrhoids   . Lactose intolerance 1998  . Migraine headache childhood  . Osteoporosis 2010  . Polyarthralgia   . Rosacea 1975  . Seborrheic dermatitis age 84  . Tinnitus 1999  .  Uterine prolapse, 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   Past Surgical History:  Procedure Laterality Date  . COLONOSCOPY  2003, 04/11/2011   20012: diminutive polyp, diverticulosis, internal hemorrhoids  . 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   Review of Systems As per HPI  Objective:   Physical Exam BP 126/84 (BP Location: Left Arm, Patient Position: Sitting, Cuff Size: Normal)   Pulse 76   Ht 5\' 5"  (1.651 m)   Wt 164 lb 8 oz (74.6 kg)   BMI 27.37 kg/m  Elderly black woman looking stated age or younger no acute distress Abdomen soft nontender no organomegaly or mass   Rectal exam with Jackolyn Confer, CMA present  Anoderm inspection revealed no abnormalities Anal wink was positive Digital exam revealed normal resting tone and decreased voluntary squeeze. Any rectocele present no mass nontender exam Simulated defecation with valsalva revealed appropriate abdominal contraction and suspected increased descent.

## 2018-07-27 ENCOUNTER — Other Ambulatory Visit: Payer: Medicare Other

## 2018-07-27 DIAGNOSIS — K582 Mixed irritable bowel syndrome: Secondary | ICD-10-CM

## 2018-07-27 DIAGNOSIS — K30 Functional dyspepsia: Secondary | ICD-10-CM

## 2018-07-28 LAB — HELICOBACTER PYLORI  SPECIAL ANTIGEN
MICRO NUMBER:: 91508738
SPECIMEN QUALITY: ADEQUATE

## 2018-07-28 NOTE — Progress Notes (Signed)
Please let her know she does not have H pylori.

## 2018-08-03 ENCOUNTER — Ambulatory Visit: Payer: Medicare Other | Attending: Internal Medicine | Admitting: Physical Therapy

## 2018-08-03 ENCOUNTER — Encounter: Payer: Self-pay | Admitting: Physical Therapy

## 2018-08-03 ENCOUNTER — Other Ambulatory Visit: Payer: Self-pay

## 2018-08-03 DIAGNOSIS — M6281 Muscle weakness (generalized): Secondary | ICD-10-CM | POA: Insufficient documentation

## 2018-08-03 DIAGNOSIS — R279 Unspecified lack of coordination: Secondary | ICD-10-CM | POA: Insufficient documentation

## 2018-08-03 DIAGNOSIS — R109 Unspecified abdominal pain: Secondary | ICD-10-CM

## 2018-08-03 NOTE — Patient Instructions (Addendum)
About Abdominal Massage  Abdominal massage, also called external colon massage, is a self-treatment circular massage technique that can reduce and eliminate gas and ease constipation. The colon naturally contracts in waves in a clockwise direction starting from inside the right hip, moving up toward the ribs, across the belly, and down inside the left hip.  When you perform circular abdominal massage, you help stimulate your colon's normal wave pattern of movement called peristalsis.  It is most beneficial when done after eating.  Positioning You can practice abdominal massage with oil while lying down, or in the shower with soap.  Some people find that it is just as effective to do the massage through clothing while sitting or standing.  How to Massage Start by placing your finger tips or knuckles on your right side, just inside your hip bone.  . Make small circular movements while you move upward toward your rib cage.   . Once you reach the bottom right side of your rib cage, take your circular movements across to the left side of the bottom of your rib cage.  . Next, move downward until you reach the inside of your left hip bone.  This is the path your feces travel in your colon. . Continue to perform your abdominal massage in this pattern for 10 minutes each day.     You can apply as much pressure as is comfortable in your massage.  Start gently and build pressure as you continue to practice.  Notice any areas of pain as you massage; areas of slight pain may be relieved as you massage, but if you have areas of significant or intense pain, consult with your healthcare provider.  Other Considerations . General physical activity including bending and stretching can have a beneficial massage-like effect on the colon.  Deep breathing can also stimulate the colon because breathing deeply activates the same nervous system that supplies the colon.   . Abdominal massage should always be used in  combination with a bowel-conscious diet that is high in the proper type of fiber for you, fluids (primarily water), and a regular exercise program. Eating    Sit, protecting natural arch in low back. Bring food to mouth, not mouth to food. Do not lean on elbows or arms.  Copyright  VHI. All rights reserved.  Positioning on Side in Bed    Place pillow, long enough to support knees and feet, between legs. Pillow under head and/or neck to protect alignment of this area. Place pillow in front for support of upper arm. Consider use of a body pillow. Avoid "fetal" position.  Copyright  VHI. All rights reserved.  Quick Contraction: Gravity Eliminated (Hook-Lying)    Lie with hips and knees bent. Quickly squeeze then fully relax pelvic floor. Perform _1__ sets of _5__. Rest for __1_ seconds between sets. Do _4__ times a day.   Copyright  VHI. All rights reserved.  [facility] [address], [place], [place] [address] Phone # [phone] Fax [phone]

## 2018-08-03 NOTE — Therapy (Signed)
[facility] [address], [place], [place], [address] Phone: [phone]   Fax:  [phone]  Physical Therapy Evaluation  Patient Details  Name: Deborah Conner MRN: [id number] Date of Birth: [birth year] Referring Provider (PT): Dr. Silvano Rusk   Encounter Date: 08/03/2018  PT End of Session - 08/03/18 1020    Visit Number  1    Date for PT Re-Evaluation  09/28/18    Authorization Type  Medicare/BCBS    PT Start Time  0930    PT Stop Time  1015    PT Time Calculation (min)  45 min    Activity Tolerance  Patient tolerated treatment well;No increased pain    Behavior During Therapy  WFL for tasks assessed/performed       Past Medical History:  Diagnosis Date  . Anal fissure   . Anxiety and depression 1998/1973   some panic  . Aortic sclerosis 2005  . Diverticulosis 2003  . GERD (gastroesophageal reflux disease) 2001  . Gestational diabetes   . Glucose intolerance (impaired glucose tolerance) 2000  . Heart murmur   . HLD (hyperlipidemia)   . IBS (irritable bowel syndrome) 1998  . Insomnia 2003  . Internal hemorrhoids   . Lactose intolerance 1998  . Migraine headache childhood  . Osteoporosis 2010  . Polyarthralgia   . Rosacea 1975  . Seborrheic dermatitis age 5  . Tinnitus 1999  . Uterine prolapse, 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    Past Surgical History:  Procedure Laterality Date  . COLONOSCOPY  2003, 04/11/2011   20012: diminutive polyp, diverticulosis, internal hemorrhoids  . UPPER GASTROINTESTINAL ENDOSCOPY  01/30/2006, 04/11/2011   2007 and 2012:Normal     There were no vitals filed for this visit.   Subjective Assessment - 08/03/18 0935    Subjective  Patient reports gut issues her life. Patient reports she may be on a regular pattern for stools. When the IBS occurs and she has to rush to the bathroom. Patient will have a period of right upper abdominal pain. Patient has been wearing a pessary for  5-6 years Patient reports with the pessary she has to use the rest room immediately. Patient notices a little leak just when she gets up. Patient will wear a pad some days.     Patient Stated Goals  increase control of pelvic floor, improve quality of life     Currently in Pain?  Yes    Pain Score  2    can increase to 5/10   Pain Location  Abdomen    Pain Orientation  Right;Upper    Pain Descriptors / Indicators  Dull    Pain Type  Acute pain    Pain Onset  More than a month ago    Pain Frequency  Intermittent    Aggravating Factors   drink a cup of coffee    Pain Relieving Factors  heat, ginger tea, getting up and movine around    Multiple Pain Sites  No         [facility] PT Assessment - 08/03/18 0001      Assessment   Medical Diagnosis  K58.2 irritable bowel syndrome with both constipation anddiarrhea; K30 Functional dyspepsia; n81.4 Uterine prolapse; N81.89 Pelvic floor weakness    Referring Provider (PT)  Dr. Silvano Rusk    Onset Date/Surgical Date  01/09/18    Prior Therapy  none      Precautions   Precautions  None      Restrictions  Weight Bearing Restrictions  No      Balance Screen   Has the patient fallen in the past 6 months  No    Has the patient had a decrease in activity level because of a fear of falling?   No    Is the patient reluctant to leave their home because of a fear of falling?   No      Home Film/video editor residence      Prior Function   Level of Independence  Independent    Vocation  Retired    Leisure  stopped her walking      Cognition   Overall Cognitive Status  Within Functional Limits for tasks assessed      Posture/Postural Control   Posture/Postural Control  Postural limitations    Postural Limitations  Rounded Shoulders;Forward head      ROM / Strength   AROM / PROM / Strength  AROM;PROM;Strength      AROM   Overall AROM   Within functional limits for tasks performed      PROM   Overall PROM    Within functional limits for tasks performed      Strength   Right Hip External Rotation   4/5    Left Hip External Rotation  4/5    Left Hip ABduction  3+/5      Flexibility   Soft Tissue Assessment /Muscle Length  yes    Quadriceps  tight bilaterally      Palpation   SI assessment   left ilium rotated anteriorly    Palpation comment  tightness in the diaphgram and upper midline of abdomen; soreness located on lateral upper left hip      Transfers   Transfers  Independent with all Transfers                Objective measurements completed on examination: See above findings.    Pelvic Floor Special Questions - 08/03/18 0001    Urinary Leakage  Yes    Pad use  1    Activities that cause leaking  With strong urge   gettin up in the morning   Urinary urgency  Yes    Pelvic Floor Internal Exam  Patient confirms identification and approves PT to assess pelvic floor and treatment    Exam Type  Vaginal    Strength  weak squeeze, no lift    Strength # of seconds  1       [facility] Adult PT Treatment/Exercise - 08/03/18 0001      Therapeutic Activites    Therapeutic Activities  Other Therapeutic Activities    Other Therapeutic Activities  eating posture, sit up tall, chew food fuly      Manual Therapy   Manual Therapy  Soft tissue mobilization;Myofascial release    Soft tissue mobilization  circular massage to improve peristalic motion of intestines, soft tissue work to the diaphgram,     Myofascial Release  upper midline of the abdomen to release the fascia             PT Education - 08/03/18 1019    Education Details  abdominal massage; pelvic floor contraction, posture with eating and sleeping    Person(s) Educated  Patient    Methods  Explanation;Demonstration;Verbal cues;Handout    Comprehension  Returned demonstration;Verbalized understanding       PT Short Term Goals - 08/03/18 1028      PT SHORT TERM GOAL #1  Title  independent with initial HEP     Time  4    Period  Weeks    Status  New    Target Date  08/31/18      PT SHORT TERM GOAL #2   Title  understand vaginal health to reduce dryness    Time  4    Period  Weeks    Status  New    Target Date  08/31/18      PT SHORT TERM GOAL #3   Title  pain in right upper quadrant decreased >/= 25% due to improve posture and tissue mobility    Time  4    Period  Weeks    Status  New    Target Date  08/31/18      PT SHORT TERM GOAL #4   Title  ability to contract the pelvic floor for 8 seconds due to increased endurance    Time  4    Period  Weeks    Status  New    Target Date  08/31/18        PT Long Term Goals - 08/03/18 1030      PT LONG TERM GOAL #1   Title  independent with HEP and understand how to progress her exercises    Time  8    Period  Weeks    Status  New    Target Date  09/28/18      PT LONG TERM GOAL #2   Title  pelvic floor strength >/= 3/5 holding for 10 seconds to reduce urinary leakage >/= 80%    Time  8    Period  Weeks    Status  New    Target Date  09/28/18      PT LONG TERM GOAL #3   Title  right upper quadrant pain decreased >/= 80% due to improved tissue mobility and posture    Time  8    Period  Weeks    Status  New    Target Date  09/28/18      PT LONG TERM GOAL #4   Title  urge to urinate decreased >/= 60% so she is able to walk slowly to the bathroom    Time  8    Period  Weeks    Status  New    Target Date  09/28/18      PT LONG TERM GOAL #5   Title  urge to have a bowel movement when her IBS acts up decreased >/= 50% so she is able to walk to the bathroom slowly    Time  8    Period  Weeks    Status  New    Target Date  09/28/18             Plan - 08/03/18 1020    Clinical Impression Statement  Patient is a 67 year old female with IBS, uterine prolapse, functional dyspepsia and pelvic floor weakness. Patient reports intermittent pain 2-5/10 in right upper quadrant with drinking coffee and eating. Patient has  tightness in the diaphgram and upper midline abdominal area. Patient reports she will leak urine in the morning or when she gets the urge to urinate. Patient will wear 1 pad per day. Pelvic floor strength is 2/5 holding 1 second. Patient has tightness along the urethra sphincter. Patient is presently wearing a pessary due to urterine prolpase. Patient will have to quickly go to the bathroom when she has the urge  for a bowel movement due to her IBS and weak pelvic floor. Patient has weakness in hips and left ilium is rotated posteriorly. Patient will benefit from skilled therapy to improve pelvic floor strength to reduce leakage and the urge.     History and Personal Factors relevant to plan of care:  uterine prolapse, dyspepsia, osteoporosis    Clinical Presentation  Evolving    Clinical Presentation due to:  makes it difficult to be in public when she has the urge to urinate or have a bowel movement    Clinical Decision Making  Moderate    Rehab Potential  Excellent    Clinical Impairments Affecting Rehab Potential  none    PT Frequency  1x / week    PT Duration  8 weeks    PT Treatment/Interventions  Biofeedback;Electrical Stimulation;Moist Heat;Cryotherapy;Ultrasound;Therapeutic activities;Therapeutic exercise;Neuromuscular re-education;Manual techniques;Patient/family education;Dry needling    PT Next Visit Plan  pelvic floor contraction with ball squeeze, abdominal contraction, soft tissue work internally and upper abdominal quadrant, information on vaginal dryness    Consulted and Agree with Plan of Care  Patient       Patient will benefit from skilled therapeutic intervention in order to improve the following deficits and impairments:  Increased fascial restricitons, Pain, Decreased coordination, Decreased mobility, Increased muscle spasms, Decreased activity tolerance, Decreased strength  Visit Diagnosis: Muscle weakness (generalized) - Plan: PT plan of care cert/re-cert  Unspecified lack  of coordination - Plan: PT plan of care cert/re-cert  Abdominal pain, unspecified abdominal location - Plan: PT plan of care cert/re-cert     Problem List Patient Active Problem List   Diagnosis Date Noted  . Uterine prolapse, 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  . Menopausal symptoms 12/09/2011  . Vaginal bleeding 12/09/2011  . Pelvic pressure in female 12/09/2011  . Overweight(278.02) 12/09/2011  . Osteoporosis 12/09/2011  . Other dysphagia 02/24/2011  . Unspecified hemorrhoids with other complication 31/54/0086  . IBS (irritable bowel syndrome) 02/24/2011  . Lactose intolerance 02/24/2011    Earlie Counts, PT 08/03/18 10:36 AM   [facility] [address] [place], [place], [address] Phone: [phone]   Fax:  [phone]  Name: Deborah Conner MRN: [id number] Date of Birth: [birth year]

## 2018-08-16 ENCOUNTER — Telehealth: Payer: Self-pay | Admitting: Internal Medicine

## 2018-08-16 NOTE — Telephone Encounter (Signed)
Pt is to take SIBO test.  Pt reports that she had two BM last Thursday 08/12/18.  Pt would like to know if she should start the test today.

## 2018-08-16 NOTE — Telephone Encounter (Signed)
All questions about breath test answered.  She will call back for any additional questions or concerns.

## 2018-08-17 ENCOUNTER — Ambulatory Visit: Payer: Medicare Other | Attending: Internal Medicine | Admitting: Physical Therapy

## 2018-08-17 ENCOUNTER — Encounter: Payer: Self-pay | Admitting: Physical Therapy

## 2018-08-17 DIAGNOSIS — R279 Unspecified lack of coordination: Secondary | ICD-10-CM | POA: Diagnosis present

## 2018-08-17 DIAGNOSIS — M6281 Muscle weakness (generalized): Secondary | ICD-10-CM | POA: Diagnosis not present

## 2018-08-17 DIAGNOSIS — R109 Unspecified abdominal pain: Secondary | ICD-10-CM | POA: Diagnosis present

## 2018-08-17 NOTE — Patient Instructions (Addendum)
Introduction to Bowel Health Diet and daily habits can help you predict when your bowels will move on a regular basis.  The consistency and quantity of the stool is usually more important than the frequency.  The goal is to have a regular bowel movement that is soft but formed.   Tips on Emptying Regularly . Eat breakfast.  Usually the best time of day for a bowel movement will be a half hour to an hour after eating.  These times are best because the body uses the gastrocolic reflex, a stimulation of bowel motion that occurs with eating, to help produce a bowel movement.  For some people even a simple hot drink in the morning can help the reflex action begin. . Eat all your meals at a predictable time each day.  The bowel functions best when food is introduced at the same regular intervals. . The amount of food eaten at a given time of day should be about the same size from day to day.  The bowel functions best when food is introduced in similar quantities from day to day. It is fine to have a small breakfast and a large lunch, or vice versa, just be consistent. . Eat two servings of fruit or vegetables and at least one serving of a complex carbohydrates (whole grains such as brown rice, bran, whole wheat bread, or oatmeal) at each meal. . Drink plenty of water-ideally eight glasses a day.  Be sure to increase your water intake if you are increasing fiber into your diet. . Drink a glass of warm liquid in the morning before or during breakfast.  Maintain Healthy Habits . Exercise daily.  You may exercise at any time of day, but you may find that bowel function is helped most if the exercise is at a consistent time each day. . Make sure that you are not rushed and have convenient access to a bathroom at your selected time to empty your bowels.     Begin walking 30 minutes per day.   Moisturizers . They are used in the vagina to hydrate the mucous membrane that make up the vaginal  canal. . Designed to keep a more normal acid balance (ph) . Once placed in the vagina, it will last between two to three days.  . Use 2-3 times per week at bedtime and last longer than 60 min. . Ingredients to avoid is glycerin and fragrance, can increase chance of infection . Should not be used just before sex due to causing irritation . Most are gels administered either in a tampon-shaped applicator or as a vaginal suppository. They are non-hormonal.   Types of Moisturizers . Samul Dada- drug store . Vitamin E vaginal suppositories- Whole foods, Amazon . Moist Again . Coconut oil- can break down condoms . Julva- (Do no use if on Tamoxifen) amazon . Yes moisturizer- amazon . NeuEve Silk , NeuEve Silver for menopausal or over 65 (if have severe vaginal atrophy or cancer treatments use NeuEve Silk for  1 month than move to [employer] Pepsi)- [employer], MapleFlower.dk . Olive and Bee intimate cream- www.oliveandbee.com.au . Mae vaginal moisturizer- Amazon  Creams to use externally on the Vulva area  Albertson's (good for for cancer patients that had radiation to the area)- [place] or [employer].FlyingBasics.com.br  V-magic cream - amazon  Julva-amazon  Vital "V Wild Yam salve ( help moisturize and help with thinning vulvar area, does have Amoret  Cleo by Damiva labial moisturizer ([place],    Things to avoid in the vaginal area . Do not use things to irritate the vulvar area . No lotions just specialized creams for the vulva area- Neogyn, V-magic, No soaps; can use Aveeno or Calendula cleanser if needed. Must be gentle . No deodorants . No douches . Good to sleep without underwear to let the vaginal area to air out . No scrubbing: spread the lips to let warm water rinse over labias and pat dry  Lubrication . Used for intercourse to reduce friction . Avoid ones that have glycerin, warming gels, tingling gels,  icing or cooling gel, scented . Avoid parabens due to a preservative similar to female sex hormone . May need to be reapplied once or several times during sexual activity . Can be applied to both partners genitals prior to vaginal penetration to minimize friction or irritation . Prevent irritation and mucosal tears that cause post coital pain and increased the risk of vaginal and urinary tract infections . Oil-based lubricants cannot be used with condoms due to breaking them down.  Least likely to irritate vaginal tissue.  . Plant based-lubes are safe . Silicone-based lubrication are thicker and last long and used for post-menopausal women  Vaginal Lubricators Here is a list of some suggested lubricators you can use for intercourse. Use the most hypoallergenic product.  You can place on you or your partner.   Slippery Stuff  Sylk or Sliquid Natural H2O ( good  if frequent UTI's)  Blossom Organics (www.blossom-organics.com)  Luvena   Coconut oil  PJur Woman Nude- water based lubricant, amazon  Uberlube- Amazon  Aloe Vera  Yes lubricant- [employer] Platinum-Silicone, Target, [facility]  Olive and Bee intimate cream-  www.oliveandbee.com.au  Pink - Amazon Things to avoid in lubricants are glycerin, warming gels, tingling gels, icing or cooling  gels, and scented gels.  Also avoid Vaseline. KY jelly, Replens, and Astroglide kills good bacteria(lactobacilli)  Things to avoid in the vaginal area . Do not use things to irritate the vulvar area . No lotions- see below . No soaps; can use Aveeno or Calendula cleanser if needed. Must be gentle . No deodorants . No douches . Good to sleep without underwear to let the vaginal area to air out . No scrubbing: spread the lips to let warm water rinse over labias and pat dry  Creams that can be used on the [place] [employer] or [facility] [address] [place], [place] [address] Phone # [phone] Fax [phone]

## 2018-08-17 NOTE — Therapy (Signed)
[facility] [address], [place], [place], [address] Phone: [phone]   Fax:  [phone]  Physical Therapy Treatment  Patient Details  Name: Deborah Conner MRN: [id number] Date of Birth: [birth year] Referring Provider (PT): Dr. Silvano Rusk   Encounter Date: 08/17/2018  PT End of Session - 08/17/18 0901    Visit Number  2    Date for PT Re-Evaluation  09/28/18    Authorization Type  Medicare/BCBS    PT Start Time  0900   came late   PT Stop Time  0930    PT Time Calculation (min)  30 min    Activity Tolerance  Patient tolerated treatment well;No increased pain    Behavior During Therapy  WFL for tasks assessed/performed       Past Medical History:  Diagnosis Date  . Anal fissure   . Anxiety and depression 1998/1973   some panic  . Aortic sclerosis 2005  . Diverticulosis 2003  . GERD (gastroesophageal reflux disease) 2001  . Gestational diabetes   . Glucose intolerance (impaired glucose tolerance) 2000  . Heart murmur   . HLD (hyperlipidemia)   . IBS (irritable bowel syndrome) 1998  . Insomnia 2003  . Internal hemorrhoids   . Lactose intolerance 1998  . Migraine headache childhood  . Osteoporosis 2010  . Polyarthralgia   . Rosacea 1975  . Seborrheic dermatitis age 68  . Tinnitus 1999  . Uterine prolapse, 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    Past Surgical History:  Procedure Laterality Date  . COLONOSCOPY  2003, 04/11/2011   20012: diminutive polyp, diverticulosis, internal hemorrhoids  . UPPER GASTROINTESTINAL ENDOSCOPY  01/30/2006, 04/11/2011   2007 and 2012:Normal     There were no vitals filed for this visit.  Subjective Assessment - 08/17/18 0902    Subjective  I have been able to burp more and got more relief. I have been massaging. Eating is hardest to chewing more.     Patient Stated Goals  increase control of pelvic floor, improve quality of life     Currently in Pain?  Yes         [facility]  PT Assessment - 08/17/18 0001      Palpation   SI assessment   pelvis in correct alignment                   [facility] Adult PT Treatment/Exercise - 08/17/18 0001      Self-Care   Self-Care  Other Self-Care Comments    Other Self-Care Comments   eating habits and walking program; information on vaginal lubricants, moisturizers and bowel health      Manual Therapy   Manual Therapy  Soft tissue mobilization    Soft tissue mobilization  circular massage to improve peristalic motion of intestines, soft tissue work to the diaphgram,     Myofascial Release  upper midline of the abdomen to release the fascia             PT Education - 08/17/18 0931    Education Details  vaginal moisturizers and lubricants, bowel health    Person(s) Educated  Patient    Methods  Explanation;Verbal cues;Handout    Comprehension  Verbalized understanding       PT Short Term Goals - 08/03/18 1028      PT SHORT TERM GOAL #1   Title  independent with initial HEP    Time  4    Period  Weeks    Status  New    Target Date  08/31/18      PT SHORT TERM GOAL #2   Title  understand vaginal health to reduce dryness    Time  4    Period  Weeks    Status  New    Target Date  08/31/18      PT SHORT TERM GOAL #3   Title  pain in right upper quadrant decreased >/= 25% due to improve posture and tissue mobility    Time  4    Period  Weeks    Status  New    Target Date  08/31/18      PT SHORT TERM GOAL #4   Title  ability to contract the pelvic floor for 8 seconds due to increased endurance    Time  4    Period  Weeks    Status  New    Target Date  08/31/18        PT Long Term Goals - 08/03/18 1030      PT LONG TERM GOAL #1   Title  independent with HEP and understand how to progress her exercises    Time  8    Period  Weeks    Status  New    Target Date  09/28/18      PT LONG TERM GOAL #2   Title  pelvic floor strength >/= 3/5 holding for 10 seconds to reduce urinary leakage  >/= 80%    Time  8    Period  Weeks    Status  New    Target Date  09/28/18      PT LONG TERM GOAL #3   Title  right upper quadrant pain decreased >/= 80% due to improved tissue mobility and posture    Time  8    Period  Weeks    Status  New    Target Date  09/28/18      PT LONG TERM GOAL #4   Title  urge to urinate decreased >/= 60% so she is able to walk slowly to the bathroom    Time  8    Period  Weeks    Status  New    Target Date  09/28/18      PT LONG TERM GOAL #5   Title  urge to have a bowel movement when her IBS acts up decreased >/= 50% so she is able to walk to the bathroom slowly    Time  8    Period  Weeks    Status  New    Target Date  09/28/18            Plan - 08/17/18 0932    Clinical Impression Statement  Patient has not met goals due to just starting therapy. Patient is learning how to use vaginal lubricants and moisturizers to reduce dryness and improve vaginal health. Patient has increased tissue mobility of the upper abdomen. Patient is chewing her food for easier digestion. Patient will benefit ftom skilled therapy to improve pelvic floor strenth to reduce leakage and the urge.     Rehab Potential  Excellent    Clinical Impairments Affecting Rehab Potential  none    PT Frequency  1x / week    PT Duration  8 weeks    PT Treatment/Interventions  Biofeedback;Electrical Stimulation;Moist Heat;Cryotherapy;Ultrasound;Therapeutic activities;Therapeutic exercise;Neuromuscular re-education;Manual techniques;Patient/family education;Dry needling    PT Next Visit Plan  pelvic floor contraction with ball squeeze, abdominal contraction, soft tissue work  internally and upper abdominal quadrant,    Recommended Other Services  MD signed initial eval    Consulted and Agree with Plan of Care  Patient       Patient will benefit from skilled therapeutic intervention in order to improve the following deficits and impairments:  Increased fascial restricitons, Pain,  Decreased coordination, Decreased mobility, Increased muscle spasms, Decreased activity tolerance, Decreased strength  Visit Diagnosis: Muscle weakness (generalized)  Unspecified lack of coordination  Abdominal pain, unspecified abdominal location     Problem List Patient Active Problem List   Diagnosis Date Noted  . Uterine prolapse, 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  . Menopausal symptoms 12/09/2011  . Vaginal bleeding 12/09/2011  . Pelvic pressure in female 12/09/2011  . Overweight(278.02) 12/09/2011  . Osteoporosis 12/09/2011  . Other dysphagia 02/24/2011  . Unspecified hemorrhoids with other complication 50/04/3817  . IBS (irritable bowel syndrome) 02/24/2011  . Lactose intolerance 02/24/2011    Earlie Counts, PT 08/17/18 9:36 AM   [facility] [address], [place], [place], [address] Phone: [phone]   Fax:  [phone]  Name: Lexine Jaspers MRN: [id number] Date of Birth: 10-18-1950

## 2018-08-23 ENCOUNTER — Encounter: Payer: Self-pay | Admitting: Physical Therapy

## 2018-08-23 ENCOUNTER — Ambulatory Visit: Payer: Medicare Other | Admitting: Physical Therapy

## 2018-08-23 DIAGNOSIS — R109 Unspecified abdominal pain: Secondary | ICD-10-CM

## 2018-08-23 DIAGNOSIS — M6281 Muscle weakness (generalized): Secondary | ICD-10-CM | POA: Diagnosis not present

## 2018-08-23 DIAGNOSIS — R279 Unspecified lack of coordination: Secondary | ICD-10-CM

## 2018-08-23 NOTE — Therapy (Signed)
[facility] [address], [place], [place], [address] Phone: [phone]   Fax:  [phone]  Physical Therapy Treatment  Patient Details  Name: Deborah Conner MRN: [id number] Date of Birth: March 16, 1951 Referring Provider (PT): Dr. Silvano Rusk   Encounter Date: 08/23/2018  PT End of Session - 08/23/18 1118    Visit Number  3    Date for PT Re-Evaluation  09/28/18    Authorization Type  Medicare/BCBS    PT Start Time  1100    PT Stop Time  1140    PT Time Calculation (min)  40 min    Activity Tolerance  Patient tolerated treatment well;No increased pain    Behavior During Therapy  WFL for tasks assessed/performed       Past Medical History:  Diagnosis Date  . Anal fissure   . Anxiety and depression 1998/1973   some panic  . Aortic sclerosis 2005  . Diverticulosis 2003  . GERD (gastroesophageal reflux disease) 2001  . Gestational diabetes   . Glucose intolerance (impaired glucose tolerance) 2000  . Heart murmur   . HLD (hyperlipidemia)   . IBS (irritable bowel syndrome) 1998  . Insomnia 2003  . Internal hemorrhoids   . Lactose intolerance 1998  . Migraine headache childhood  . Osteoporosis 2010  . Polyarthralgia   . Rosacea 1975  . Seborrheic dermatitis age 70  . Tinnitus 1999  . Uterine prolapse, 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    Past Surgical History:  Procedure Laterality Date  . COLONOSCOPY  2003, 04/11/2011   20012: diminutive polyp, diverticulosis, internal hemorrhoids  . UPPER GASTROINTESTINAL ENDOSCOPY  01/30/2006, 04/11/2011   2007 and 2012:Normal     There were no vitals filed for this visit.  Subjective Assessment - 08/23/18 1109    Subjective  I have to strain to have a bowel movement.     Patient Stated Goals  increase control of pelvic floor, improve quality of life     Pain Score  2     Pain Location  Abdomen    Pain Orientation  Right;Upper    Pain Descriptors / Indicators  Dull     Pain Type  Acute pain    Pain Onset  More than a month ago    Pain Frequency  Intermittent    Aggravating Factors   drink cup of coffee    Pain Relieving Factors  heat, ginger tea, getting up and moving around    Multiple Pain Sites  No                    Pelvic Floor Special Questions - 08/23/18 0001    Pelvic Floor Internal Exam  Patient confirms identification and approves PT to assess pelvic floor and treatment    Exam Type  Vaginal    Strength  weak squeeze, no lift    Strength # of seconds  2        [facility] Adult PT Treatment/Exercise - 08/23/18 0001      Therapeutic Activites    Therapeutic Activities  Other Therapeutic Activities    Other Therapeutic Activities  correct toileting technique with diaphgram atic breathing and relaxation of the pelvic floor      Manual Therapy   Manual Therapy  Internal Pelvic Floor    Internal Pelvic Floor  urethra sphincter,urethra sphincter, perineal body             PT Education - 08/23/18 1116    Education Details  toileting techinque technique; pelvic floor contraction in hookly    Person(s) Educated  Patient    Methods  Explanation;Demonstration;Verbal cues;Handout    Comprehension  Verbalized understanding;Returned demonstration       PT Short Term Goals - 08/03/18 1028      PT SHORT TERM GOAL #1   Title  independent with initial HEP    Time  4    Period  Weeks    Status  New    Target Date  08/31/18      PT SHORT TERM GOAL #2   Title  understand vaginal health to reduce dryness    Time  4    Period  Weeks    Status  New    Target Date  08/31/18      PT SHORT TERM GOAL #3   Title  pain in right upper quadrant decreased >/= 25% due to improve posture and tissue mobility    Time  4    Period  Weeks    Status  New    Target Date  08/31/18      PT SHORT TERM GOAL #4   Title  ability to contract the pelvic floor for 8 seconds due to increased endurance    Time  4    Period  Weeks    Status  New     Target Date  08/31/18        PT Long Term Goals - 08/03/18 1030      PT LONG TERM GOAL #1   Title  independent with HEP and understand how to progress her exercises    Time  8    Period  Weeks    Status  New    Target Date  09/28/18      PT LONG TERM GOAL #2   Title  pelvic floor strength >/= 3/5 holding for 10 seconds to reduce urinary leakage >/= 80%    Time  8    Period  Weeks    Status  New    Target Date  09/28/18      PT LONG TERM GOAL #3   Title  right upper quadrant pain decreased >/= 80% due to improved tissue mobility and posture    Time  8    Period  Weeks    Status  New    Target Date  09/28/18      PT LONG TERM GOAL #4   Title  urge to urinate decreased >/= 60% so she is able to walk slowly to the bathroom    Time  8    Period  Weeks    Status  New    Target Date  09/28/18      PT LONG TERM GOAL #5   Title  urge to have a bowel movement when her IBS acts up decreased >/= 50% so she is able to walk to the bathroom slowly    Time  8    Period  Weeks    Status  New    Target Date  09/28/18            Plan - 08/23/18 1118    Clinical Impression Statement  Patient is using concnut oil to the vaginal ara and is helping the dryness. Patient pelvic floor strength is 2/5 with decreased mobility of the sphincter muscles and perineal body. Patient needs tactile cues to contract the pelvic floor muscles. Patient needs assistance to perform diaphgramatic breathing. Patient will beneif tfrom skilled  therapy to improve pelvic floor strength to reduce leakage and the urge.     Rehab Potential  Excellent    Clinical Impairments Affecting Rehab Potential  none    PT Frequency  1x / week    PT Duration  8 weeks    PT Treatment/Interventions  Biofeedback;Electrical Stimulation;Moist Heat;Cryotherapy;Ultrasound;Therapeutic activities;Therapeutic exercise;Neuromuscular re-education;Manual techniques;Patient/family education;Dry needling    PT Next Visit Plan  pelvic  floor contraction with ball squeeze, abdominal contraction, soft tissue work internally and upper abdominal quadrant,    Consulted and Agree with Plan of Care  Patient       Patient will benefit from skilled therapeutic intervention in order to improve the following deficits and impairments:  Increased fascial restricitons, Pain, Decreased coordination, Decreased mobility, Increased muscle spasms, Decreased activity tolerance, Decreased strength  Visit Diagnosis: Muscle weakness (generalized)  Unspecified lack of coordination  Abdominal pain, unspecified abdominal location     Problem List Patient Active Problem List   Diagnosis Date Noted  . Uterine prolapse, 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  . Menopausal symptoms 12/09/2011  . Vaginal bleeding 12/09/2011  . Pelvic pressure in female 12/09/2011  . Overweight(278.02) 12/09/2011  . Osteoporosis 12/09/2011  . Other dysphagia 02/24/2011  . Unspecified hemorrhoids with other complication 95/32/0233  . IBS (irritable bowel syndrome) 02/24/2011  . Lactose intolerance 02/24/2011    Earlie Counts, PT 08/23/18 11:48 AM   08/23/2018, 11:47 AM  [facility] [address], [place], [place], [address] Phone: [phone]   Fax:  [phone]  Name: Deborah Conner MRN: [id number] Date of Birth: 06/18/1951

## 2018-08-23 NOTE — Patient Instructions (Addendum)
Toileting Techniques for Bowel Movements (Defecation) Using your belly (abdomen) and pelvic floor muscles to have a bowel movement is usually instinctive.  Sometimes people can have problems with these muscles and have to relearn proper defecation (emptying) techniques.  If you have weakness in your muscles, organs that are falling out, decreased sensation in your pelvis, or ignore your urge to go, you may find yourself straining to have a bowel movement.  You are straining if you are: . holding your breath or taking in a huge gulp of air and holding it  . keeping your lips and jaw tensed and closed tightly . turning red in the face because of excessive pushing or forcing . developing or worsening your  hemorrhoids . getting faint while pushing . not emptying completely and have to defecate many times a day  If you are straining, you are actually making it harder for yourself to have a bowel movement.  Many people find they are pulling up with the pelvic floor muscles and closing off instead of opening the anus. Due to lack pelvic floor relaxation and coordination the abdominal muscles, one has to work harder to push the feces out.  Many people have never been taught how to defecate efficiently and effectively.  Notice what happens to your body when you are having a bowel movement.  While you are sitting on the toilet pay attention to the following areas: . Jaw and mouth position . Angle of your hips   . Whether your feet touch the ground or not . Arm placement  . Spine position . Waist . Belly tension . Anus (opening of the anal canal)  An Evacuation/Defecation Plan   Here are the 4 basic points:  1. Lean forward enough for your elbows to rest on your knees 2. Support your feet on the floor or use a low stool if your feet don't touch the floor  3. Push out your belly as if you have swallowed a beach ball-you should feel a widening of your waist 4. Open and relax your pelvic floor muscles,  rather than tightening around the anus      The following conditions my require modifications to your toileting posture:  . If you have had surgery in the past that limits your back, hip, pelvic, knee or ankle flexibility . Constipation   Your healthcare practitioner may make the following additional suggestions and adjustments:  1) Sit on the toilet  a) Make sure your feet are supported. b) Notice your hip angle and spine position-most people find it effective to lean forward or raise their knees, which can help the muscles around the anus to relax  c) When you lean forward, place your forearms on your thighs for support  2) Relax suggestions a) Breath deeply in through your nose and out slowly through your mouth as if you are smelling the flowers and blowing out the candles. b) To become aware of how to relax your muscles, contracting and releasing muscles can be helpful.  Pull your pelvic floor muscles in tightly by using the image of holding back gas, or closing around the anus (visualize making a circle smaller) and lifting the anus up and in.  Then release the muscles and your anus should drop down and feel open. Repeat 5 times ending with the feeling of relaxation. c) Keep your pelvic floor muscles relaxed; let your belly bulge out. d) The digestive tract starts at the mouth and ends at the anal opening, so be   sure to relax both ends of the tube.  Place your tongue on the roof of your mouth with your teeth separated.  This helps relax your mouth and will help to relax the anus at the same time.  3) Empty (defecation) a) Keep your pelvic floor and sphincter relaxed, then bulge your anal muscles.  Make the anal opening wide.  b) Stick your belly out as if you have swallowed a beach ball. c) Make your belly wall hard using your belly muscles while continuing to breathe. Doing this makes it easier to open your anus. d) Breath out and give a grunt (or try using other sounds such as  ahhhh, shhhhh, ohhhh or grrrrrrr).  4) Finish a) As you finish your bowel movement, pull the pelvic floor muscles up and in.  This will leave your anus in the proper place rather than remaining pushed out and down. If you leave your anus pushed out and down, it will start to feel as though that is normal and give you incorrect signals about needing to have a bowel movement.    Earlie Counts, PT [facility] [address] [place], [place] [address] Adduction: Hip - Knees Together With Pelvic Floor (Hook-Lying)    Lie with hips and knees bent, towel roll between knees. Squeeze pelvic floor while pushing knees together. Hold for __5_ seconds. Rest for _5__ seconds. Repeat _10__ times. Do _2__ times a day.   Copyright  VHI. All rights reserved.  [facility] [address] [place], [place] [address] Phone # [phone] Fax [phone]

## 2018-08-24 ENCOUNTER — Encounter: Payer: Medicare Other | Admitting: Physical Therapy

## 2018-08-25 ENCOUNTER — Encounter: Payer: Self-pay | Admitting: Internal Medicine

## 2018-08-25 DIAGNOSIS — K6389 Other specified diseases of intestine: Secondary | ICD-10-CM

## 2018-08-25 DIAGNOSIS — K638219 Small intestinal bacterial overgrowth, unspecified: Secondary | ICD-10-CM | POA: Insufficient documentation

## 2018-08-25 HISTORY — DX: Other specified diseases of intestine: K63.89

## 2018-08-25 HISTORY — DX: Small intestinal bacterial overgrowth, unspecified: K63.8219

## 2018-08-26 ENCOUNTER — Telehealth: Payer: Self-pay

## 2018-08-26 MED ORDER — AMOXICILLIN-POT CLAVULANATE 875-125 MG PO TABS: 1.0000 | ORAL_TABLET | Freq: Two times a day (BID) | ORAL | 0 refills | Status: DC

## 2018-08-26 NOTE — Telephone Encounter (Signed)
Patient notified of the results and recommendations.  Follow up arranged for 09/06/18. Rx sent

## 2018-08-26 NOTE — Telephone Encounter (Signed)
-----   Message from Gatha Mayer, MD sent at 08/25/2018  5:53 PM EST ----- Regarding: SIBO + Her SIBO test is +  Have her take Augmentin 875 mg bid x 14 days  Then see me in February  Second half of month

## 2018-08-30 ENCOUNTER — Other Ambulatory Visit: Payer: Self-pay | Admitting: Family Medicine

## 2018-08-30 DIAGNOSIS — N289 Disorder of kidney and ureter, unspecified: Secondary | ICD-10-CM

## 2018-08-31 ENCOUNTER — Encounter: Payer: Self-pay | Admitting: Physical Therapy

## 2018-08-31 ENCOUNTER — Ambulatory Visit: Payer: Medicare Other | Admitting: Physical Therapy

## 2018-08-31 DIAGNOSIS — R279 Unspecified lack of coordination: Secondary | ICD-10-CM

## 2018-08-31 DIAGNOSIS — M6281 Muscle weakness (generalized): Secondary | ICD-10-CM

## 2018-08-31 DIAGNOSIS — R109 Unspecified abdominal pain: Secondary | ICD-10-CM

## 2018-08-31 NOTE — Patient Instructions (Addendum)
Quick Contraction: Gravity Eliminated (Side-Lying)    Lie on left side, hips and knees slightly bent. Quickly squeeze then fully relax pelvic floor. Perform __1_ sets of __5_. Rest for __1_ seconds between sets. Do _3__ times a day.   Copyright  VHI. All rights reserved.  Slow Contraction: Gravity Eliminated (Side-Lying)    Lie on left side, hips and knees slightly bent. Slowly squeeze pelvic floor for _3__ seconds. Rest for __3_ seconds. Repeat _5__ times. Do ___3 times a day. Then contract for 5 seconds rest 5 sec 5 times 3 times per day.    Copyright  VHI. All rights reserved.   [facility] [address], [place], [place] [address] Phone # [phone] Fax [phone]

## 2018-08-31 NOTE — Therapy (Signed)
[facility] [address], [place], [place], [address] Phone: [phone]   Fax:  [phone]  Physical Therapy Treatment  Patient Details  Name: Deborah Conner MRN: [id number] Date of Birth: 11/27/1950 Referring Provider (PT): Dr. Silvano Rusk   Encounter Date: 08/31/2018  PT End of Session - 08/31/18 0854    Visit Number  4    Date for PT Re-Evaluation  09/28/18    Authorization Type  Medicare/BCBS    PT Start Time  0845    PT Stop Time  0930    PT Time Calculation (min)  45 min    Activity Tolerance  Patient tolerated treatment well;No increased pain    Behavior During Therapy  WFL for tasks assessed/performed       Past Medical History:  Diagnosis Date  . Anal fissure   . Anxiety and depression 1998/1973   some panic  . Aortic sclerosis 2005  . Diverticulosis 2003  . GERD (gastroesophageal reflux disease) 2001  . Gestational diabetes   . Glucose intolerance (impaired glucose tolerance) 2000  . Heart murmur   . HLD (hyperlipidemia)   . IBS (irritable bowel syndrome) 1998  . Insomnia 2003  . Internal hemorrhoids   . Lactose intolerance 1998  . Migraine headache childhood  . Osteoporosis 2010  . Polyarthralgia   . Rosacea 1975  . Seborrheic dermatitis age 89  . 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 Tinnitus 1999  . Uterine prolapse, 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    Past Surgical History:  Procedure Laterality Date  . COLONOSCOPY  2003, 04/11/2011   [address]12: diminutive polyp, diverticulosis, internal hemorrhoids  . UPPER GASTROINTESTINAL ENDOSCOPY  01/30/2006, 04/11/2011   2007 and 2012:Normal     There were no vitals filed for this visit.  Subjective Assessment - 08/31/18 0849    Subjective  I had some pain in the upper abdominal. I have bacteria in my intestines so I will start antibiotics. My bowel movements are better.     Patient Stated Goals  increase control of pelvic floor, improve  quality of life     Currently in Pain?  No/denies    Multiple Pain Sites  No         [facility] PT Assessment - 08/31/18 0001      Assessment   Medical Diagnosis  K58.2 irritable bowel syndrome with both constipation anddiarrhea; K30 Functional dyspepsia; n81.4 Uterine prolapse; N81.89 Pelvic floor weakness                Pelvic Floor Special Questions - 08/31/18 0001    Strength  weak squeeze, no lift    Biofeedback  sidely resting tone is 1/5 uv; quick flicks to 5 uv; Hold 5 sec and not able to do, hold 3 sec above 2 uv is hard, contract with laugh. Verbal cues to isolate the pelvic floor    Biofeedback sensor type  Surface   vaginal               PT Education - 08/31/18 1030    Education Details  pelvic floor exercise in sidely working on quick flicks and endurance     Person(s) Educated  Patient    Methods  Demonstration;Explanation;Verbal cues;Handout    Comprehension  Returned demonstration;Verbalized understanding       PT Short Term Goals - 08/31/18 0853      PT SHORT TERM GOAL #1   Title  independent with initial HEP    Time  4  Period  Weeks    Status  Achieved      PT SHORT TERM GOAL #2   Title  understand vaginal health to reduce dryness    Time  4    Period  Weeks    Status  Achieved      PT SHORT TERM GOAL #3   Title  pain in right upper quadrant decreased >/= 25% due to improve posture and tissue mobility    Time  4    Period  Weeks    Status  Achieved      PT SHORT TERM GOAL #4   Title  ability to contract the pelvic floor for 8 seconds due to increased endurance    Baseline  contract 5 seconds    Time  4    Period  Weeks    Status  On-going        PT Long Term Goals - 08/31/18 0856      PT LONG TERM GOAL #1   Title  independent with HEP and understand how to progress her exercises    Time  8    Period  Weeks    Status  On-going      PT LONG TERM GOAL #2   Title  pelvic floor strength >/= 3/5 holding for 10 seconds to  reduce urinary leakage >/= 80%    Baseline  urinary leakage improved by 60%    Time  8    Period  Weeks    Status  On-going      PT LONG TERM GOAL #3   Title  right upper quadrant pain decreased >/= 80% due to improved tissue mobility and posture    Time  8    Period  Weeks    Status  On-going      PT LONG TERM GOAL #4   Title  urge to urinate decreased >/= 60% so she is able to walk slowly to the bathroom    Baseline  30% better    Time  8    Period  Weeks    Status  On-going      PT LONG TERM GOAL #5   Title  urge to have a bowel movement when her IBS acts up decreased >/= 50% so she is able to walk to the bathroom slowly    Time  8    Period  Weeks    Status  Achieved            Plan - 08/31/18 0855    Clinical Impression Statement  Patient reports urinary leakage improved by 60%. Patient has 6-7 bowel movements per week. Patient urge to urinate has improved by 30%. Patient able to contract for 3 seconds then 5 seconds. Patient is able to do a quick flick with isolating the pelvic floor. Patient is able to contract from 1 uv to 2 uv. Patient will benefit from skilled therapy to improve pelvic floor strnegth to reduce leakage and the urge.     Rehab Potential  Excellent    PT Frequency  1x / week    PT Duration  8 weeks    PT Treatment/Interventions  Biofeedback;Electrical Stimulation;Moist Heat;Cryotherapy;Ultrasound;Therapeutic activities;Therapeutic exercise;Neuromuscular re-education;Manual techniques;Patient/family education;Dry needling    PT Next Visit Plan  internal pelvic floor soft tissue work; pelvic floor contraction; soft tissue work to abdomen    Consulted and Agree with Plan of Care  Patient       Patient will benefit from skilled therapeutic intervention  in order to improve the following deficits and impairments:  Increased fascial restricitons, Pain, Decreased coordination, Decreased mobility, Increased muscle spasms, Decreased activity tolerance,  Decreased strength  Visit Diagnosis: Muscle weakness (generalized)  Unspecified lack of coordination  Abdominal pain, unspecified abdominal location     Problem List Patient Active Problem List   Diagnosis Date Noted  . 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 Uterine prolapse, 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  . Menopausal symptoms 12/09/2011  . Vaginal bleeding 12/09/2011  . Pelvic pressure in female 12/09/2011  . Overweight(278.02) 12/09/2011  . Osteoporosis 12/09/2011  . Other dysphagia 02/24/2011  . Unspecified hemorrhoids with other complication 84/72/0721  . IBS (irritable bowel syndrome) 02/24/2011  . Lactose intolerance 02/24/2011    Earlie Counts, PT 08/31/18 10:41 AM   [facility] [address], [place], [place], [address] Phone: [phone]   Fax:  [phone]  Name: Takela Varden MRN: [id number] Date of Birth: 22-Feb-1951

## 2018-09-02 ENCOUNTER — Other Ambulatory Visit: Payer: Medicare Other

## 2018-09-03 ENCOUNTER — Ambulatory Visit
Admission: RE | Admit: 2018-09-03 | Discharge: 2018-09-03 | Disposition: A | Discharge status: Home / Self Care | Payer: Medicare Other | Source: Ambulatory Visit | Attending: Family Medicine | Admitting: Family Medicine

## 2018-09-03 DIAGNOSIS — N289 Disorder of kidney and ureter, unspecified: Secondary | ICD-10-CM

## 2018-09-03 MED ORDER — IOPAMIDOL (ISOVUE-300) INJECTION 61%
100.0000 mL | Freq: Once | INTRAVENOUS | Status: AC | PRN
  Administered 2018-09-03: 100 mL via INTRAVENOUS

## 2018-09-07 ENCOUNTER — Ambulatory Visit: Payer: Medicare Other | Admitting: Physical Therapy

## 2018-09-07 ENCOUNTER — Encounter: Payer: Self-pay | Admitting: Physical Therapy

## 2018-09-07 DIAGNOSIS — M6281 Muscle weakness (generalized): Secondary | ICD-10-CM

## 2018-09-07 DIAGNOSIS — R279 Unspecified lack of coordination: Secondary | ICD-10-CM

## 2018-09-07 DIAGNOSIS — R109 Unspecified abdominal pain: Secondary | ICD-10-CM

## 2018-09-07 NOTE — Patient Instructions (Addendum)
Adduction: Hip - Knees Together (Hook-Lying)    Lie with hips and knees bent, towel roll between knees. Push knees together. Hold for _5__ seconds. Rest for _5__ seconds. Repeat _10__ times. Do _3__ times a day.   Copyright  VHI. All rights reserved.  External Rotation: Hip - Knees Apart (Hook-Lying)    Lie with hips and knees bent, band tied just above knees. Pull knees apart. Hold for _5__ seconds. Rest for _5__ seconds. Repeat _10__ times. Do _3__ times a day.  Copyright  VHI. All rights reserved.  Unilateral Isometric Hip Flexion    Tighten stomach and raise right knee to outstretched arm. Push gently, keeping arm straight, trunk rigid. Hold __5__ seconds. Repeat __10__ times per set. Do ___1_ sets per session. Do __3__ sessions per day.  http://orth.exer.us/1099   Copyright  VHI. All rights reserved.  [facility] [address] [place], [place] [address] Phone # [phone] Fax [phone]

## 2018-09-07 NOTE — Therapy (Signed)
[facility] [address] [place], [place], [address] Phone: [phone]   Fax:  [phone]  Physical Therapy Treatment  Patient Details  Name: Deborah Conner MRN: [id number] Date of Birth: 18-Apr-1951 Referring Provider (PT): Dr. Silvano Rusk   Encounter Date: 09/07/2018  PT End of Session - 09/07/18 0854    Visit Number  5    Date for PT Re-Evaluation  09/28/18    Authorization Type  Medicare/BCBS    PT Start Time  0845    PT Stop Time  0925    PT Time Calculation (min)  40 min    Activity Tolerance  Patient tolerated treatment well;No increased pain    Behavior During Therapy  WFL for tasks assessed/performed       Past Medical History:  Diagnosis Date  . Anal fissure   . Anxiety and depression 1998/1973   some panic  . Aortic sclerosis 2005  . Diverticulosis 2003  . GERD (gastroesophageal reflux disease) 2001  . Gestational diabetes   . Glucose intolerance (impaired glucose tolerance) 2000  . Heart murmur   . HLD (hyperlipidemia)   . IBS (irritable bowel syndrome) 1998  . Insomnia 2003  . Internal hemorrhoids   . Lactose intolerance 1998  . Migraine headache childhood  . Osteoporosis 2010  . Polyarthralgia   . Rosacea 1975  . Seborrheic dermatitis age 66  . 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 Tinnitus 1999  . Uterine prolapse, 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    Past Surgical History:  Procedure Laterality Date  . COLONOSCOPY  2003, 04/11/2011   [address]: diminutive polyp, diverticulosis, internal hemorrhoids  . UPPER GASTROINTESTINAL ENDOSCOPY  01/30/2006, 04/11/2011   2007 and 2012:Normal     There were no vitals filed for this visit.  Subjective Assessment - 09/07/18 0848    Subjective  I feel pretty good. I had 1-2 bouts with pain in the upper right quadrant. I had a CT scan friday. Abdominal pain is 60% better. Much better frequency and less intense.     Patient Stated Goals   increase control of pelvic floor, improve quality of life     Currently in Pain?  No/denies                    Pelvic Floor Special Questions - 09/07/18 0001    Pelvic Floor Internal Exam  Patient confirms identification and approves PT to assess pelvic floor and treatment    Exam Type  Vaginal    Palpation  tightness around the introitus    Strength  weak squeeze, no lift    Strength # of seconds  2   needs to co-contract the hip adductors, VC to not hold breat       [facility] Adult PT Treatment/Exercise - 09/07/18 0001      Neuro Re-ed    Neuro Re-ed Details   tactles cues to contract the pelvic floor in hookly and using the hip adductors to contract the pelvic floor.      Lumbar Exercises: Supine   Clam  10 reps;5 seconds   contract pelvic floor   Clam Limitations  with yellow band    Isometric Hip Flexion  20 reps;5 seconds    Isometric Hip Flexion Limitations  with pelvic floor contraction    Other Supine Lumbar Exercises  hookly pelvic floor contraction with ball squeeze 10 times hold 5 sec      Manual Therapy   Manual Therapy  Internal  Pelvic Floor    Internal Pelvic Floor  urethra sphincter,, perineal body, wall of introitus             PT Education - 09/07/18 0920    Education Details  pelvic floor exercise in supine with ball squeeze and hip ER; hip flexion isometric    Person(s) Educated  Patient    Methods  Explanation;Demonstration;Verbal cues;Handout    Comprehension  Verbalized understanding;Returned demonstration       PT Short Term Goals - 08/31/18 0853      PT SHORT TERM GOAL #1   Title  independent with initial HEP    Time  4    Period  Weeks    Status  Achieved      PT SHORT TERM GOAL #2   Title  understand vaginal health to reduce dryness    Time  4    Period  Weeks    Status  Achieved      PT SHORT TERM GOAL #3   Title  pain in right upper quadrant decreased >/= 25% due to improve posture and tissue mobility    Time  4     Period  Weeks    Status  Achieved      PT SHORT TERM GOAL #4   Title  ability to contract the pelvic floor for 8 seconds due to increased endurance    Baseline  contract 5 seconds    Time  4    Period  Weeks    Status  On-going        PT Long Term Goals - 09/07/18 0850      PT LONG TERM GOAL #1   Title  independent with HEP and understand how to progress her exercises    Time  8    Period  Weeks    Status  On-going      PT LONG TERM GOAL #2   Title  pelvic floor strength >/= 3/5 holding for 10 seconds to reduce urinary leakage >/= 80%    Baseline  urinary leakage improved by 90%    Time  8    Period  Weeks    Status  Achieved      PT LONG TERM GOAL #3   Title  right upper quadrant pain decreased >/= 80% due to improved tissue mobility and posture    Baseline  60% better    Time  8    Period  Weeks    Status  On-going      PT LONG TERM GOAL #4   Title  urge to urinate decreased >/= 60% so she is able to walk slowly to the bathroom    Baseline  ---    Time  8    Period  Weeks    Status  Achieved      PT LONG TERM GOAL #5   Title  urge to have a bowel movement when her IBS acts up decreased >/= 50% so she is able to walk to the bathroom slowly    Time  8    Period  Weeks    Status  Achieved            Plan - 09/07/18 0847    Clinical Impression Statement  Patient reports her urinary leakage is 90% better. Patient is having no difficulty with emptying her stool. Patient is not straining. Abdominal pain has improved by 60% with intensity and frequency. Pelvic floor strength is 2/5 with tactile cues  to contract the pelvic floor and verbal cues to not hold her breath. Patient will benefit from skilled therapy to improve pelvic floor strength and reduce abdominal pain.     Rehab Potential  Excellent    Clinical Impairments Affecting Rehab Potential  none    PT Frequency  1x / week    PT Treatment/Interventions  Biofeedback;Electrical Stimulation;Moist  Heat;Cryotherapy;Ultrasound;Therapeutic activities;Therapeutic exercise;Neuromuscular re-education;Manual techniques;Patient/family education;Dry needling    PT Next Visit Plan   pelvic floor contraction; soft tissue work to abdomen    Consulted and Agree with Plan of Care  Patient       Patient will benefit from skilled therapeutic intervention in order to improve the following deficits and impairments:  Increased fascial restricitons, Pain, Decreased coordination, Decreased mobility, Increased muscle spasms, Decreased activity tolerance, Decreased strength  Visit Diagnosis: Muscle weakness (generalized)  Unspecified lack of coordination  Abdominal pain, unspecified abdominal location     Problem List Patient Active Problem List   Diagnosis Date Noted  . 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 Uterine prolapse, 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  . Menopausal symptoms 12/09/2011  . Vaginal bleeding 12/09/2011  . Pelvic pressure in female 12/09/2011  . Overweight(278.02) 12/09/2011  . Osteoporosis 12/09/2011  . Other dysphagia 02/24/2011  . Unspecified hemorrhoids with other complication 75/43/6067  . IBS (irritable bowel syndrome) 02/24/2011  . Lactose intolerance 02/24/2011    Earlie Counts, PT 09/07/18 9:26 AM    [facility] [address], [place], [place], [address] Phone: [phone]   Fax:  [phone]  Name: Deborah Conner MRN: [id number] Date of Birth: 1951-05-25

## 2018-09-14 ENCOUNTER — Encounter: Payer: Self-pay | Admitting: Physical Therapy

## 2018-09-14 ENCOUNTER — Ambulatory Visit: Payer: Medicare Other | Attending: Internal Medicine | Admitting: Physical Therapy

## 2018-09-14 DIAGNOSIS — R109 Unspecified abdominal pain: Secondary | ICD-10-CM | POA: Insufficient documentation

## 2018-09-14 DIAGNOSIS — M6281 Muscle weakness (generalized): Secondary | ICD-10-CM | POA: Insufficient documentation

## 2018-09-14 DIAGNOSIS — R279 Unspecified lack of coordination: Secondary | ICD-10-CM | POA: Insufficient documentation

## 2018-09-14 NOTE — Therapy (Signed)
[facility] [address] [place], [place], [address] Phone: [phone]   Fax:  [phone]  Physical Therapy Treatment  Patient Details  Name: Deborah Conner MRN: [id number] Date of Birth: Dec 24, 1950 Referring Provider (PT): Dr. Silvano Rusk   Encounter Date: 09/14/2018  PT End of Session - 09/14/18 0918    Visit Number  6    Date for PT Re-Evaluation  09/28/18    Authorization Type  Medicare/BCBS    PT Start Time  0845    PT Stop Time  0925    PT Time Calculation (min)  40 min    Activity Tolerance  Patient tolerated treatment well;No increased pain    Behavior During Therapy  WFL for tasks assessed/performed       Past Medical History:  Diagnosis Date  . Anal fissure   . Anxiety and depression 1998/1973   some panic  . Aortic sclerosis 2005  . Diverticulosis 2003  . GERD (gastroesophageal reflux disease) 2001  . Gestational diabetes   . Glucose intolerance (impaired glucose tolerance) 2000  . Heart murmur   . HLD (hyperlipidemia)   . IBS (irritable bowel syndrome) 1998  . Insomnia 2003  . Internal hemorrhoids   . Lactose intolerance 1998  . Migraine headache childhood  . Osteoporosis 2010  . Polyarthralgia   . Rosacea 1975  . Seborrheic dermatitis age 61  . 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 Tinnitus 1999  . Uterine prolapse, 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    Past Surgical History:  Procedure Laterality Date  . COLONOSCOPY  2003, 04/11/2011   20012: diminutive polyp, diverticulosis, internal hemorrhoids  . UPPER GASTROINTESTINAL ENDOSCOPY  01/30/2006, 04/11/2011   2007 and 2012:Normal     There were no vitals filed for this visit.  Subjective Assessment - 09/14/18 0848    Subjective  Every thing else is still coming along. I feel the prolapse is lower. I have tried to keep the pessary in more. I had some times when I went to the bathroom and leaked on the way just alittle bit.     Patient Stated Goals  increase control of pelvic floor, improve quality of life     Currently in Pain?  No/denies                    Pelvic Floor Special Questions - 09/14/18 0001    Biofeedback  hookly ball squeeze contract to 5 uv for 5 seconds isolating the muscles with tactile cues to contract the lower abdominals and breathing out; hookly clam with yellow band with pelvic floor contraction, breathout and tighten lower abdominals, resisted knee flexion in hookly with pelvic floor contraction    Biofeedback sensor type  Surface   vaginal       [facility] Adult PT Treatment/Exercise - 09/14/18 0001      Manual Therapy   Manual Therapy  Taping    Kinesiotex  Facilitate Muscle      Kinesiotix   Facilitate Muscle   lower abdominal bilaterally               PT Short Term Goals - 09/14/18 0849      PT SHORT TERM GOAL #4   Title  ability to contract the pelvic floor for 8 seconds due to increased endurance    Time  4    Period  Weeks    Status  On-going        PT Long Term Goals - 09/14/18  35      PT LONG TERM GOAL #1   Title  independent with HEP and understand how to progress her exercises    Baseline  still learning    Time  8    Period  Weeks    Status  On-going      PT LONG TERM GOAL #2   Title  pelvic floor strength >/= 3/5 holding for 10 seconds to reduce urinary leakage >/= 80%    Baseline  urinary leakage improved by 90% but unable to hold 10 seconds, only holds 3-5 seconds in hookly    Time  8    Period  Weeks    Status  On-going      PT LONG TERM GOAL #3   Title  right upper quadrant pain decreased >/= 80% due to improved tissue mobility and posture    Time  8    Period  Weeks    Status  Achieved      PT LONG TERM GOAL #4   Title  urge to urinate decreased >/= 60% so she is able to walk slowly to the bathroom    Time  8    Period  Weeks    Status  Achieved      PT LONG TERM GOAL #5   Title  urge to have a bowel movement when her  IBS acts up decreased >/= 50% so she is able to walk to the bathroom slowly    Time  8    Period  Weeks    Status  Achieved            Plan - 09/14/18 0851    Clinical Impression Statement  Patient needs tactile cues and kinesio tape to cue in the lower abdominals to assist in pelvic floor contraction. When patient contracts the abdominals she will hold her pelvic floor contraction 5-8 seconds instead of 3 seconds. Patient is able to contract in hookly to 6uv-8 uv. Patient is not straining with bowel movements. Patient abdominal pain is 85% better. Patient will leak urine on the way to the bathroom due to pelvic floor weakness and decreased endurance. Patient sometimes will bear down when she is trying to contract the pelvic floor. Patient will benefit from skilled therapy to improve pelvic floor strength and reduce abdominal strength.     Rehab Potential  Excellent    Clinical Impairments Affecting Rehab Potential  none    PT Frequency  1x / week    PT Duration  8 weeks    PT Treatment/Interventions  Biofeedback;Electrical Stimulation;Moist Heat;Cryotherapy;Ultrasound;Therapeutic activities;Therapeutic exercise;Neuromuscular re-education;Manual techniques;Patient/family education;Dry needling    PT Next Visit Plan   pelvic floor contraction with EMG; soft tissue work to abdomen    Consulted and Agree with Plan of Care  Patient       Patient will benefit from skilled therapeutic intervention in order to improve the following deficits and impairments:  Increased fascial restricitons, Pain, Decreased coordination, Decreased mobility, Increased muscle spasms, Decreased activity tolerance, Decreased strength  Visit Diagnosis: Muscle weakness (generalized)  Unspecified lack of coordination  Abdominal pain, unspecified abdominal location     Problem List Patient Active Problem List   Diagnosis Date Noted  . 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 Uterine prolapse,  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  . Menopausal symptoms 12/09/2011  . Vaginal bleeding 12/09/2011  . Pelvic pressure in female 12/09/2011  . Overweight(278.02) 12/09/2011  . Osteoporosis 12/09/2011  . Other dysphagia 02/24/2011  . Unspecified hemorrhoids  with other complication 93/23/5573  . IBS (irritable bowel syndrome) 02/24/2011  . Lactose intolerance 02/24/2011    Earlie Counts, PT 09/14/18 9:24 AM   [facility] [address], [place], [place], [address] Phone: [phone]   Fax:  [phone]  Name: Deborah Conner MRN: [id number] Date of Birth: 01/13/1951

## 2018-09-15 ENCOUNTER — Ambulatory Visit (INDEPENDENT_AMBULATORY_CARE_PROVIDER_SITE_OTHER): Payer: Medicare Other | Admitting: Podiatry

## 2018-09-15 ENCOUNTER — Encounter: Payer: Self-pay | Admitting: Podiatry

## 2018-09-15 VITALS — BP 166/93 | HR 71

## 2018-09-15 DIAGNOSIS — M2042 Other hammer toe(s) (acquired), left foot: Secondary | ICD-10-CM

## 2018-09-15 DIAGNOSIS — T300 Burn of unspecified body region, unspecified degree: Secondary | ICD-10-CM | POA: Diagnosis not present

## 2018-09-15 DIAGNOSIS — L603 Nail dystrophy: Secondary | ICD-10-CM | POA: Diagnosis not present

## 2018-09-15 NOTE — Progress Notes (Signed)
This patient presents the office for an evaluation of her left foot.  She says she has had constant problems with cracking on her outside border nail plate of her left foot.  She states she has had no history of trauma or injury to the nail.  She denies any drainage or pain from the site.  She presents the office today for evaluation of this nail.  She also says she has a painful fifth toe left foot.  She says that she has a corn on the fifth toe of the left foot and has treated her corn with acid pads.  She says this  fifth toe is now painful walking and wearing her shoes.  She presents the office today for an evaluation of her left foot.  Vascular  Dorsalis pedis and posterior tibial pulses are palpable  B/L.  Capillary return  WNL.  Temperature gradient is  WNL.  Skin turgor  WNL  Sensorium  Senn Weinstein monofilament wire  WNL. Normal tactile sensation.  Nail Exam  Patient has normal nails with no evidence of bacterial or fungal infection. There is a transverse nail plate separation on lateral aspect left hallux.    Orthopedic  Exam  Muscle tone and muscle strength  WNL.  No limitations of motion feet  B/L.  No crepitus or joint effusion noted.  Foot type is unremarkable and digits show no abnormalities.  Bony prominences are unremarkable. DJD 1st MPJ  B/L.Hammer toe second  B/l.  Adducto-varus fifth digit left foot.  Skin  No open lesions.  Normal skin texture and turgor. Necrotic tissue noted on the dorsolateral aspect fifth toe left foot.  Nail Dystrophy left hallux.  Burn fifth toe secondary to acid usage on corn fifth toe left foot.  IE.  Discussed the nail condition with this patient.  Told her trauma could have caused this nail bed to be injured thus allowing the nail plate to be unattached at the distal lateral aspect left hallux.  This can be caused from the hammertoe second which is sitting on the left hallux.  Debridement of the necrotic tissue of the fifth toe of the left foot was  performed.  She was told to discontinue usage of the acid.  Padding was given to this patient.  Return to the clinic as needed  Gardiner Barefoot DPM

## 2018-09-21 ENCOUNTER — Encounter: Payer: Self-pay | Admitting: Physical Therapy

## 2018-09-21 ENCOUNTER — Ambulatory Visit: Payer: Medicare Other | Admitting: Physical Therapy

## 2018-09-21 DIAGNOSIS — R109 Unspecified abdominal pain: Secondary | ICD-10-CM

## 2018-09-21 DIAGNOSIS — M6281 Muscle weakness (generalized): Secondary | ICD-10-CM | POA: Diagnosis not present

## 2018-09-21 DIAGNOSIS — R279 Unspecified lack of coordination: Secondary | ICD-10-CM

## 2018-09-21 NOTE — Patient Instructions (Addendum)
Bracing With Bridging (Hook-Lying)    With neutral spine, tighten pelvic floor and abdominals and hold. Lift bottom. Repeat _10__ times. Do _1__ times a day.  Squeeze a pillow between knees with exercise Copyright  VHI. All rights reserved.  [place] [address], [place], [place] [address] Phone # [phone] Fax [phone]

## 2018-09-21 NOTE — Therapy (Signed)
[facility] [address] [place], [place], [address] Phone: [phone]   Fax:  [phone]  Physical Therapy Treatment  Patient Details  Name: Deborah Conner MRN: [id number] Date of Birth: 1950-09-10 Referring Provider (PT): Dr. Silvano Rusk   Encounter Date: 09/21/2018  PT End of Session - 09/21/18 0850    Visit Number  7    Date for PT Re-Evaluation  09/28/18    Authorization Type  Medicare/BCBS    PT Start Time  0845    PT Stop Time  0925    PT Time Calculation (min)  40 min    Activity Tolerance  Patient tolerated treatment well;No increased pain    Behavior During Therapy  WFL for tasks assessed/performed       Past Medical History:  Diagnosis Date  . Anal fissure   . Anxiety and depression 1998/1973   some panic  . Aortic sclerosis 2005  . Diverticulosis 2003  . GERD (gastroesophageal reflux disease) 2001  . Gestational diabetes   . Glucose intolerance (impaired glucose tolerance) 2000  . Heart murmur   . HLD (hyperlipidemia)   . IBS (irritable bowel syndrome) 1998  . Insomnia 2003  . Internal hemorrhoids   . Lactose intolerance 1998  . Migraine headache childhood  . Osteoporosis 2010  . Polyarthralgia   . Rosacea 1975  . Seborrheic dermatitis age 29  . 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 Tinnitus 1999  . Uterine prolapse, 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    Past Surgical History:  Procedure Laterality Date  . COLONOSCOPY  2003, 04/11/2011   [address]12: diminutive polyp, diverticulosis, internal hemorrhoids  . UPPER GASTROINTESTINAL ENDOSCOPY  01/30/2006, 04/11/2011   2007 and 2012:Normal     There were no vitals filed for this visit.  Subjective Assessment - 09/21/18 0852    Subjective  Leakage is better. I sometimes have to take the pessary out to tell if I am doing the exercise correctly.     Patient Stated Goals  increase control of pelvic floor, improve quality of life     Currently in Pain?  No/denies         [facility] PT Assessment - 09/21/18 0001      Assessment   Medical Diagnosis  K58.2 irritable bowel syndrome with both constipation anddiarrhea; K30 Functional dyspepsia; n81.4 Uterine prolapse; N81.89 Pelvic floor weakness    Referring Provider (PT)  Dr. Silvano Rusk    Onset Date/Surgical Date  01/09/18    Prior Therapy  none      Precautions   Precautions  None      Restrictions   Weight Bearing Restrictions  No      Strength   Right Hip External Rotation   5/5    Left Hip External Rotation  5/5    Left Hip ABduction  4-/5      Palpation   Palpation comment  No tenderness in the abdomen                Pelvic Floor Special Questions - 09/21/18 0001    Biofeedback  hookly contract 5 sec 4 sec with lower abdominal contraction, clam with yellow band needs VC to tighten pelvic floor further.     Biofeedback sensor type  Surface   vaginal       [facility] Adult PT Treatment/Exercise - 09/21/18 0001      Lumbar Exercises: Supine   Ab Set  10 reps;5 seconds   tactile cues to engage lower  and upper abdominals   Clam  10 reps;5 seconds   contract pelvic floor   Clam Limitations  with yellow band    Bridge with Ball Squeeze  15 reps;5 seconds    Isometric Hip Flexion  20 reps;5 seconds    Isometric Hip Flexion Limitations  with pelvic floor contraction             PT Education - 09/21/18 0928    Education Details  pelvic floor contraction with bridge and ball squeeze    Person(s) Educated  Patient    Methods  Explanation;Demonstration;Verbal cues;Handout    Comprehension  Returned demonstration;Verbalized understanding       PT Short Term Goals - 09/14/18 0849      PT SHORT TERM GOAL #4   Title  ability to contract the pelvic floor for 8 seconds due to increased endurance    Time  4    Period  Weeks    Status  On-going        PT Long Term Goals - 09/21/18 9622      PT LONG TERM GOAL #1   Title  independent with  HEP and understand how to progress her exercises    Baseline  still learning    Time  8    Period  Weeks    Status  On-going      PT LONG TERM GOAL #2   Baseline  urinary leakage improved by 90% but unable to hold 10 seconds, only holds 3-5 seconds in hookly    Time  8    Period  Weeks    Status  On-going      PT LONG TERM GOAL #3   Title  right upper quadrant pain decreased >/= 80% due to improved tissue mobility and posture    Time  8    Period  Weeks    Status  Achieved      PT LONG TERM GOAL #4   Title  urge to urinate decreased >/= 60% so she is able to walk slowly to the bathroom    Time  8    Period  Weeks    Status  Achieved      PT LONG TERM GOAL #5   Title  urge to have a bowel movement when her IBS acts up decreased >/= 50% so she is able to walk to the bathroom slowly    Time  8    Period  Weeks    Status  Achieved            Plan - 09/21/18 0851    Clinical Impression Statement  Patient is able to engage the abdominals better with pelvic floor contraction. Patient has increased strength in bilateral hips. Patient is able to hold the pelvic floor contraction for 8 seconds in hookly. Patient is able to contract the pelvic floor better with ball squeeze and ball squeeze with bridge. Patient will benefit from skilled therapy to imporve pelvic floor strength and reduce abdominal strength.     Rehab Potential  Excellent    Clinical Impairments Affecting Rehab Potential  none    PT Frequency  1x / week    PT Duration  8 weeks    PT Treatment/Interventions  Biofeedback;Electrical Stimulation;Moist Heat;Cryotherapy;Ultrasound;Therapeutic activities;Therapeutic exercise;Neuromuscular re-education;Manual techniques;Patient/family education;Dry needling    PT Next Visit Plan   pelvic floor contraction with EMG in sitting ; write renewal     Consulted and Agree with Plan of Care  Patient  Patient will benefit from skilled therapeutic intervention in order to  improve the following deficits and impairments:  Increased fascial restricitons, Pain, Decreased coordination, Decreased mobility, Increased muscle spasms, Decreased activity tolerance, Decreased strength  Visit Diagnosis: Muscle weakness (generalized)  Unspecified lack of coordination  Abdominal pain, unspecified abdominal location     Problem List Patient Active Problem List   Diagnosis Date Noted  . 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 Uterine prolapse, 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  . Menopausal symptoms 12/09/2011  . Vaginal bleeding 12/09/2011  . Pelvic pressure in female 12/09/2011  . Overweight(278.02) 12/09/2011  . Osteoporosis 12/09/2011  . Other dysphagia 02/24/2011  . Unspecified hemorrhoids with other complication 73/22/0254  . IBS (irritable bowel syndrome) 02/24/2011  . Lactose intolerance 02/24/2011    Earlie Counts, PT 09/21/18 9:34 AM   [facility] [address], [place], [place], [address] Phone: [phone]   Fax:  [phone]  Name: Deborah Conner MRN: [id number] Date of Birth: Jan 29, 1951

## 2018-09-28 ENCOUNTER — Encounter: Payer: Self-pay | Admitting: Physical Therapy

## 2018-09-28 ENCOUNTER — Ambulatory Visit: Payer: Medicare Other | Admitting: Physical Therapy

## 2018-09-28 DIAGNOSIS — M6281 Muscle weakness (generalized): Secondary | ICD-10-CM

## 2018-09-28 DIAGNOSIS — R279 Unspecified lack of coordination: Secondary | ICD-10-CM

## 2018-09-28 DIAGNOSIS — R109 Unspecified abdominal pain: Secondary | ICD-10-CM

## 2018-09-28 NOTE — Therapy (Signed)
[facility] [address] [place], [place], [address] Phone: [phone]   Fax:  [phone]  Physical Therapy Treatment  Patient Details  Name: Deborah Conner MRN: [id number] Date of Birth: [birth year] Referring Provider (PT): Dr. Silvano Rusk   Encounter Date: 09/28/2018  PT End of Session - 09/28/18 0851    Visit Number  8    Date for PT Re-Evaluation  11/23/18    Authorization Type  Medicare/BCBS    PT Start Time  0850    PT Stop Time  0928    PT Time Calculation (min)  38 min    Activity Tolerance  Patient tolerated treatment well;No increased pain    Behavior During Therapy  WFL for tasks assessed/performed       Past Medical History:  Diagnosis Date  . Anal fissure   . Anxiety and depression 1998/1973   some panic  . Aortic sclerosis 2005  . Diverticulosis 2003  . GERD (gastroesophageal reflux disease) 2001  . Gestational diabetes   . Glucose intolerance (impaired glucose tolerance) 2000  . Heart murmur   . HLD (hyperlipidemia)   . IBS (irritable bowel syndrome) 1998  . Insomnia 2003  . Internal hemorrhoids   . Lactose intolerance 1998  . Migraine headache childhood  . Osteoporosis 2010  . Polyarthralgia   . Rosacea 1975  . Seborrheic dermatitis age 73  . 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 Tinnitus 1999  . Uterine prolapse, 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    Past Surgical History:  Procedure Laterality Date  . COLONOSCOPY  2003, 04/11/2011   [address]12: diminutive polyp, diverticulosis, internal hemorrhoids  . UPPER GASTROINTESTINAL ENDOSCOPY  01/30/2006, 04/11/2011   2007 and 2012:Normal     There were no vitals filed for this visit.  Subjective Assessment - 09/28/18 0852    Subjective  My pelvic floor is not doing well. I had a little drip when I got to the bathroom. I leave the pessary out unless I am doing work. Sometimes I have difficulty with telling if I am contracting the pelvic  floor correctly. I felt a small aomunt of abdominal pain 2 times last week and it did not last a long time.     Patient Stated Goals  increase control of pelvic floor, improve quality of life     Currently in Pain?  No/denies         [facility] PT Assessment - 09/28/18 0001      Assessment   Medical Diagnosis  K58.2 irritable bowel syndrome with both constipation anddiarrhea; K30 Functional dyspepsia; n81.4 Uterine prolapse; N81.89 Pelvic floor weakness    Referring Provider (PT)  Dr. Silvano Rusk    Onset Date/Surgical Date  01/09/18    Prior Therapy  none      Precautions   Precautions  None      Restrictions   Weight Bearing Restrictions  No      [place] residence      Prior Function   Level of Independence  Independent      Cognition   Overall Cognitive Status  Within Functional Limits for tasks assessed      Strength   Right Hip External Rotation   5/5    Left Hip External Rotation  5/5    Left Hip ABduction  4-/5                Pelvic Floor Special Questions - 09/28/18 0001  Urinary Leakage  Yes    Activities that cause leaking  With strong urge    Biofeedback  resting tone in supine is 3 uv, when roll in bed her contraction goes to 10 uv but when she has to contract on command it will go to 5 uv for 3 seconds, when patient closes her eyes and not focus on her stomach contraction is able to contract to 5-6 uv for 3 seconds; sitting resting tone is 3 uv, when sit and squeeze ball contract to 6 uv; sit to stand with pelvic floor contraction to 6 uv; stand     Biofeedback sensor type  Surface   vaginal       [facility] Adult PT Treatment/Exercise - 09/28/18 0001      Knee/Hip Exercises: Sidelying   Hip ABduction  Strengthening;Left;10 reps;3 sets   with pelvic floor contraction            PT Education - 09/28/18 0921    Education Details  pelvic floor contraction in sitting with ball squeeze, standing pelvic floor  contraction with plie ; hip abduction in sidely with pelvic floor contraction    Person(s) Educated  Patient    Methods  Explanation;Demonstration;Verbal cues;Handout    Comprehension  Returned demonstration;Verbalized understanding       PT Short Term Goals - 09/28/18 0855      PT SHORT TERM GOAL #4   Title  ability to contract the pelvic floor for 8 seconds due to increased endurance    Time  4    Period  Weeks    Status  On-going        PT Long Term Goals - 09/28/18 0855      PT LONG TERM GOAL #1   Title  independent with HEP and understand how to progress her exercises    Baseline  still learning    Time  8    Period  Weeks    Status  On-going      PT LONG TERM GOAL #2   Title  pelvic floor strength >/= 3/5 holding for 10 seconds to reduce urinary leakage >/= 80%    Baseline  urinary leakage improved by 90% but unable to hold 10 seconds, only holds 3-5 seconds in hookly    Time  8    Period  Weeks    Status  On-going      PT LONG TERM GOAL #3   Title  right upper quadrant pain decreased >/= 80% due to improved tissue mobility and posture    Time  8    Period  Weeks    Status  Achieved      PT LONG TERM GOAL #4   Title  urge to urinate decreased >/= 60% so she is able to walk slowly to the bathroom    Time  8    Period  Weeks    Status  Achieved      PT LONG TERM GOAL #5   Title  urge to have a bowel movement when her IBS acts up decreased >/= 50% so she is able to walk to the bathroom slowly    Time  8    Period  Weeks    Status  Achieved            Plan - 09/28/18 0929    Clinical Impression Statement  Patient has difficulty to contract her pelvic floor without assistance in supine and sidely. Patient resting tone is 2-3 uv. Patient  is able to contract the pelvic  floor with plie in standing, sitting with ball squeeze, and sidely with hip abduction. Patient will contract the pelvic floor with transisitonal movements. Patient will leak urine when she is  at the commode and pulling her pants down. Patient abdominal pain  has decreased to 2 times per week and is very short time period. Patient will contract her pelvic floor better when she is not thinking about her abdominal contraction. Patient is able to hold her pelvic floor contraction at 3 seconds. Patient will benefit from skilled therapy to improve pelvic floor strength and improve abdominal strength.     Rehab Potential  Excellent    Clinical Impairments Affecting Rehab Potential  none    PT Frequency  1x / week    PT Duration  8 weeks    PT Treatment/Interventions  Biofeedback;Electrical Stimulation;Moist Heat;Cryotherapy;Ultrasound;Therapeutic activities;Therapeutic exercise;Neuromuscular re-education;Manual techniques;Patient/family education;Dry needling    PT Next Visit Plan   pelvic floor contraction with EMG in sitting     Recommended Other Services  sent MD renewal 08/28/2018    Consulted and Agree with Plan of Care  Patient       Patient will benefit from skilled therapeutic intervention in order to improve the following deficits and impairments:  Increased fascial restricitons, Pain, Decreased coordination, Decreased mobility, Increased muscle spasms, Decreased activity tolerance, Decreased strength  Visit Diagnosis: Muscle weakness (generalized)  Unspecified lack of coordination  Abdominal pain, unspecified abdominal location     Problem List Patient Active Problem List   Diagnosis Date Noted  . 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 Uterine prolapse, 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  . Menopausal symptoms 12/09/2011  . Vaginal bleeding 12/09/2011  . Pelvic pressure in female 12/09/2011  . Overweight(278.02) 12/09/2011  . Osteoporosis 12/09/2011  . Other dysphagia 02/24/2011  . Unspecified hemorrhoids with other complication 85/88/5027  . IBS (irritable bowel syndrome) 02/24/2011  . Lactose intolerance 02/24/2011    Earlie Counts, PT 09/28/18 9:33 AM   [facility] [address], [place], [place], [address] Phone: [phone]   Fax:  [phone]  Name: Deborah Conner MRN: [id number] Date of Birth: Aug 18, 1950

## 2018-09-28 NOTE — Patient Instructions (Addendum)
Adduction: Hip - Knees Together With Pelvic Floor (Sitting)    Sit with towel roll between knees. Squeeze pelvic floor while pushing knees together. Hold for __5_ seconds. Rest for __5_ seconds. Repeat _10__ times. Do _2__ times a day.  Copyright  VHI. All rights reserved.   Knee Bend / Rise With Pelvic Floor (Standing)    Stand with feet turned out at 45, knees bent to mini-squat. While rising, squeeze pelvic floor and Hold __5_ seconds. Rest __5_ seconds. Keep Back Straight Repeat __5_ times. Do _2__ times a day.  Copyright  VHI. All rights reserved.  Strengthening: Hip Abduction (Side-Lying)    Tighten muscles on front of left thigh, then lift leg __4__ inches from surface, keeping knee locked. Tighten pelvic floor Repeat __10__ times per set. Do 3sets per session. Do _1___ sessions per day.  http://orth.exer.us/623   Copyright  VHI. All rights reserved.  Deborah Conner [address], [place],  [address] Phone # [phone] Fax [phone]

## 2018-10-05 ENCOUNTER — Ambulatory Visit: Payer: Medicare Other | Admitting: Physical Therapy

## 2018-10-05 ENCOUNTER — Encounter: Payer: Self-pay | Admitting: Physical Therapy

## 2018-10-05 DIAGNOSIS — R279 Unspecified lack of coordination: Secondary | ICD-10-CM

## 2018-10-05 DIAGNOSIS — R109 Unspecified abdominal pain: Secondary | ICD-10-CM

## 2018-10-05 DIAGNOSIS — M6281 Muscle weakness (generalized): Secondary | ICD-10-CM | POA: Diagnosis not present

## 2018-10-05 NOTE — Patient Instructions (Addendum)
Sitting    Sit comfortably. Allow body's muscles to relax. Place hands on belly. Inhale slowly and deeply for _3__ seconds, so hands move out. Then take __3_ seconds to exhale. Repeat _5__ times. Do __2_ times a day.  Copyright  VHI. All rights reserved.  Hook-Lying    Lie with hips and knees bent. Allow body's muscles to relax. Place hands on belly. Inhale slowly and deeply for _3__ seconds, so hands move up. Then take __3_ seconds to exhale. Repeat _5__ times. Do _2__ times a day.   Copyright  VHI. All rights reserved.  Do when in bed, when walking to the bathroom and you have the urge, when you have abdominal pain  [facility] [address], [place], [place] [address] Phone # [phone] Fax [phone]

## 2018-10-05 NOTE — Progress Notes (Signed)
Thanks for the update

## 2018-10-05 NOTE — Therapy (Signed)
[facility] [address], [place], [place], [address] Phone: [phone]   Fax:  [phone]  Physical Therapy Treatment  Patient Details  Name: Deborah Conner MRN: [id number] Date of Birth: [birth year] Referring Provider (PT): Dr. Silvano Rusk   Encounter Date: 10/05/2018  PT End of Session - 10/05/18 0854    Visit Number  9    Date for PT Re-Evaluation  11/23/18    Authorization Type  Medicare/BCBS    PT Start Time  0845    PT Stop Time  0925    PT Time Calculation (min)  40 min    Activity Tolerance  Patient tolerated treatment well;No increased pain    Behavior During Therapy  WFL for tasks assessed/performed       Past Medical History:  Diagnosis Date  . Anal fissure   . Anxiety and depression 1998/1973   some panic  . Aortic sclerosis 2005  . Diverticulosis 2003  . GERD (gastroesophageal reflux disease) 2001  . Gestational diabetes   . Glucose intolerance (impaired glucose tolerance) 2000  . Heart murmur   . HLD (hyperlipidemia)   . IBS (irritable bowel syndrome) 1998  . Insomnia 2003  . Internal hemorrhoids   . Lactose intolerance 1998  . Migraine headache childhood  . Osteoporosis 2010  . Polyarthralgia   . Rosacea 1975  . Seborrheic dermatitis age 2  . 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 Tinnitus 1999  . Uterine prolapse, 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    Past Surgical History:  Procedure Laterality Date  . COLONOSCOPY  2003, 04/11/2011   [address]12: diminutive polyp, diverticulosis, internal hemorrhoids  . UPPER GASTROINTESTINAL ENDOSCOPY  01/30/2006, 04/11/2011   2007 and 2012:Normal     There were no vitals filed for this visit.  Subjective Assessment - 10/05/18 0848    Subjective  Urinary leakage is 70% better. Has a little leakage when she has the urge and have to get to the bathroom. Patient is able to hold her urine until she gets to the commode when she is out. I am seeing  Dr. Carlean Purl about her abdominal pain. After I eat pain in right upper quadrant. After I have a bowel movement. Patient reports her abdominal pain is 55% better since initial evaluation at level 5/10.     Patient Stated Goals  increase control of pelvic floor, improve quality of life     Currently in Pain?  Yes    Pain Score  5     Pain Location  Abdomen    Pain Orientation  Medial;Upper    Pain Descriptors / Indicators  Dull    Pain Type  Acute pain    Pain Onset  More than a month ago    Pain Frequency  Intermittent    Aggravating Factors   after eating, after bowel movement    Pain Relieving Factors  getting up and moving around    Multiple Pain Sites  No                       [facility] Adult PT Treatment/Exercise - 10/05/18 0001      Neuro Re-ed    Neuro Re-ed Details   diaphragmatic breathing in supine and sitting using tactile and verbal cues to breath slowly and fill her abdomen up instead of her ches      Manual Therapy   Manual Therapy  Soft tissue mobilization;Myofascial release    Soft tissue mobilization  diaphgram,  rectus, transverse abdominus    Myofascial Release  midline of abdomen, upper abdomin, along the xiphoid process, linea alba             PT Education - 10/05/18 0925    Education Details  diaphgramatic breathing in sitting and supine    Methods  Explanation;Demonstration;Verbal cues;Handout    Comprehension  Returned demonstration;Verbalized understanding       PT Short Term Goals - 09/28/18 0855      PT SHORT TERM GOAL #4   Title  ability to contract the pelvic floor for 8 seconds due to increased endurance    Time  4    Period  Weeks    Status  On-going        PT Long Term Goals - 10/05/18 0930      PT LONG TERM GOAL #3   Title  right upper quadrant pain decreased >/= 80% due to improved tissue mobility and posture    Baseline  60% better    Time  8    Period  Weeks    Status  Achieved      PT LONG TERM GOAL #4   Title   urge to urinate decreased >/= 60% so she is able to walk slowly to the bathroom    Period  Weeks    Status  Achieved      PT LONG TERM GOAL #5   Title  urge to have a bowel movement when her IBS acts up decreased >/= 50% so she is able to walk to the bathroom slowly    Time  8    Period  Weeks    Status  Achieved            Plan - 10/05/18 0855    Clinical Impression Statement  Patient will have right upper abdomen pain after she eats, with a bowel movement and when she is hungry. Patient reports her abdominal pain is 55% better and will last from 10 to 30 minutes. Her pain will be better if she gets up and moves around. Ptient urinary leakage is 70% better. She is able to get to tthe bathroom in time when she is out. At home she may have some leakage when getting to the bathroom. Patient had some fascial tightness in the upper abdomen and just below the xiphoid. Patient will benefit from skilled therapy to improve pelvic floor strength and improve abdominal strength.     Rehab Potential  Excellent    Clinical Impairments Affecting Rehab Potential  none    PT Frequency  1x / week    PT Duration  8 weeks    PT Treatment/Interventions  Biofeedback;Electrical Stimulation;Moist Heat;Cryotherapy;Ultrasound;Therapeutic activities;Therapeutic exercise;Neuromuscular re-education;Manual techniques;Patient/family education;Dry needling    PT Next Visit Plan  see what MD says, internal soft tissue work to check on [employer]    Recommended Other Services  MD signed initial and renewal note    Consulted and Agree with Plan of Care  Patient       Patient will benefit from skilled therapeutic intervention in order to improve the following deficits and impairments:  Increased fascial restricitons, Pain, Decreased coordination, Decreased mobility, Increased muscle spasms, Decreased activity tolerance, Decreased strength  Visit Diagnosis: Muscle weakness (generalized)  Unspecified lack of  coordination  Abdominal pain, unspecified abdominal location     Problem List Patient Active Problem List   Diagnosis Date Noted  . 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 Uterine prolapse, 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  .  Menopausal symptoms 12/09/2011  . Vaginal bleeding 12/09/2011  . Pelvic pressure in female 12/09/2011  . Overweight(278.02) 12/09/2011  . Osteoporosis 12/09/2011  . Other dysphagia 02/24/2011  . Unspecified hemorrhoids with other complication 79/15/0569  . IBS (irritable bowel syndrome) 02/24/2011  . Lactose intolerance 02/24/2011    Earlie Counts, PT 10/05/18 9:32 AM   [facility] [address], [place], [place], [address] Phone: [phone]   Fax:  [phone]  Name: Kristin Barcus MRN: [id number] Date of Birth: 26-Nov-1950

## 2018-10-06 ENCOUNTER — Other Ambulatory Visit: Payer: Self-pay | Admitting: Family Medicine

## 2018-10-06 DIAGNOSIS — Z1231 Encounter for screening mammogram for malignant neoplasm of breast: Secondary | ICD-10-CM

## 2018-10-07 ENCOUNTER — Ambulatory Visit (INDEPENDENT_AMBULATORY_CARE_PROVIDER_SITE_OTHER): Payer: Medicare Other | Admitting: Internal Medicine

## 2018-10-07 ENCOUNTER — Encounter: Payer: Self-pay | Admitting: Internal Medicine

## 2018-10-07 DIAGNOSIS — K6389 Other specified diseases of intestine: Secondary | ICD-10-CM | POA: Diagnosis not present

## 2018-10-07 DIAGNOSIS — N814 Uterovaginal prolapse, unspecified: Secondary | ICD-10-CM

## 2018-10-07 DIAGNOSIS — K582 Mixed irritable bowel syndrome: Secondary | ICD-10-CM

## 2018-10-07 MED ORDER — AMOXICILLIN-POT CLAVULANATE 875-125 MG PO TABS: 1.0000 | ORAL_TABLET | Freq: Two times a day (BID) | ORAL | 0 refills | Status: AC

## 2018-10-07 NOTE — Progress Notes (Signed)
Deborah Conner 68 y.o. 1951/03/18 [id number]  Assessment & Plan:   Small intestinal bacterial overgrowth Much better after 2 weeks Augmentin but sxs coming back so retreat Augmentin 875 mg bid x 14 d and then let me know if that does not hold her sxs   Uterine prolapse, Pessary tx Her GYN retired Banker to urogynecology for evaluation and treatment - Dr. Landry Mellow [facility]  IBS (irritable bowel syndrome) Better w/ pelvic PT, SIBO Tx and probiotics Retreat w/ Augmentin Continue  PT and Home Exercise plan May resume probiotics after Abx tx but would take with vegetables (prebiotics) for maximal effect   Cc;Blomgren, Collier [place], MD   Subjective:   Chief Complaint: RUQ pain, IBS  HPI Here for f/u - was dx w/ SIBO after 07/2018 visit  took Augmentin 875 mg bid x 14 days and RUQ pain resolved but is coming back as post-prandial pressure. She was referred to pelvic PT and that has helped her regulate bowels and bladder issues tremendously. Dr. Raphael Gibney retired and she does not have GYN MD now.     07/22/2018 HPI  The patient is here because of continued problems with irritable bowel syndrome and right upper quadrant pain problems.  The right upper quadrant pain is a postprandial pressure its not severe she can live with it but she did not want to let it go and "missed something".  She has had a lot of upper abdominal pain over the years and multiple ultrasounds had a CCK HIDA scan in 2006 and these have all been unremarkable overall without any signs of gallstones.  Her most recent ultrasound,  07/20/2018 negative except R kidney cyst to have CT to evaluate the renal cyst. Colonoscopy 2017 diverticulosis Lower GI symptoms continue with alternating defecation, she will go for several days without much of a bowel movement and then have multiple multiple bowel movements and uses Imodium as needed at my direction a number of years ago.  Defecation can be very urgent.  Feels  poorly after those days.  Then she will eventually o revert to having bowel movements again.  She said Dr. Sandi Mariscal wondered if metformin might be causing it but she is really been on a stable dose for a long time.  No new medications. She continues to use probiotics.  1 cup of coffee a day.  No fecal incontinence.  Switched from VS L #3 to align and thinks that "helped some". I have records from Dr. Sandi Mariscal reviewed office notes of June 29, 2018, main focus there was her anxiety disorder, office note of April 30, 2018.  Labs from September show a negative hepatitis C antibody and a normal CBC.  Chemistries normal.  TSH normal.  Lipids normal.  Hemoglobin A1c 6.3%.    Wt Readings from Last 3 Encounters:  07/22/18 164 lb 8 oz (74.6 kg)  11/12/16 155 lb (70.3 kg)  04/23/16 155 lb (70.3 kg)       Allergies  Allergen Reactions  . Other Itching  . Sulfa Antibiotics Itching  . Sulfasalazine Itching  . Lactose   . Lactose Intolerance (Gi)   . Chlorthalidone Rash   Current Meds  Medication Sig  . acyclovir ointment (ZOVIRAX) 5 % as needed  . ALPRAZolam (XANAX) 0.25 MG tablet Take 0.125 mg by mouth as needed.   . betamethasone valerate ointment (VALISONE) 0.1 % betamethasone valerate 0.1 % topical ointment  . calcium carbonate (OSCAL) 1500 (600 Ca) MG TABS tablet Take 600 mg of elemental  calcium by mouth 3 (three) times a week.  Marland Kitchen CALCIUM-VITAMIN D PO Take 1 tablet by mouth daily.  . clobetasol ointment (TEMOVATE) 0.05 % APPLY TO SCALP EVERY OTHER DAY AS NEEDED FOR ITCHIN NOT TO FACE  . desonide (DESOWEN) 0.05 % cream apply to FACE twice a day for 2 weeks then daily if needed for itching  . dicyclomine (BENTYL) 20 MG tablet dicyclomine 20 mg tablet  . FLUOCINOLONE ACETONIDE SCALP 0.01 % OIL   . fluocinonide (LIDEX) 0.05 % external solution fluocinonide 0.05 % topical solution  . FLUoxetine (PROZAC) 10 MG tablet Take 10 mg by mouth daily.  . furosemide (LASIX) 20 MG tablet Take 20  mg by mouth daily.   Marland Kitchen ketoconazole (NIZORAL) 2 % shampoo APPLY TO SCALP AND SHAMPOO THE SCALP ONCE A MOMTH  . metFORMIN (GLUCOPHAGE-XR) 500 MG 24 hr tablet metformin ER 500 mg tablet,extended release 24 hr  TK 1 T PO BID PC  . Multiple Vitamin (MULTIVITAMIN) tablet Take 1 tablet by mouth daily. PURE ENCAPSULATIONS  . Polyethyl Glycol-Propyl Glycol (SYSTANE OP) Place into both eyes as needed.    . pravastatin (PRAVACHOL) 10 MG tablet pravastatin 10 mg tablet  TK 1 T PO Q NIGHT PC  . PRESCRIPTION MEDICATION CREAMS FOR SEBORRHEIC DERMATITIS  . UNABLE TO FIND fluocinolone 0.01 % scalp oil and shower cap  . valACYclovir (VALTREX) 500 MG tablet as needed.   Marland Kitchen VITAMIN D PO Take 500 mg by mouth daily.   Past Medical History:  Diagnosis Date  . Anal fissure   . Anxiety and depression 1998/1973   some panic  . Aortic sclerosis 2005  . Diverticulosis 2003  . GERD (gastroesophageal reflux disease) 2001  . Gestational diabetes   . Glucose intolerance (impaired glucose tolerance) 2000  . Heart murmur   . HLD (hyperlipidemia)   . IBS (irritable bowel syndrome) 1998  . Insomnia 2003  . Internal hemorrhoids   . Lactose intolerance 1998  . Migraine headache childhood  . Osteoporosis 2010  . Polyarthralgia   . Rosacea 1975  . Seborrheic dermatitis age 53  . 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 Tinnitus 1999  . Uterine prolapse, 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   Past Surgical History:  Procedure Laterality Date  . COLONOSCOPY  2003, 04/11/2011   20012: diminutive polyp, diverticulosis, internal hemorrhoids  . 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   Review of Systems  As above Objective:   Physical Exam BP 130/74   Pulse 70   Ht 5\' 5"  (1.651 m)   Wt 167 lb 8 oz (76 kg)   BMI 27.87 kg/m    15 minutes time spent with patient > half in counseling coordination of care

## 2018-10-07 NOTE — Patient Instructions (Addendum)
  We have sent the following medications to your pharmacy for you to pick up at your convenience: Augmentin   Start back taking your probiotics after you finish your Augmentin. Take your probiotics with vegetables.   Let us know if your symptoms come back.   We are referring you to [facility], phone # [phone]. Your appointment is with Dr Olin Pia on 11/29/2018 at 8:15AM, arrive at 8:00AM. They are located at [facility]. [place] [address]. They are going to mail you information about this appointment.   I appreciate the opportunity to care for you. Silvano Rusk, MD, [facility]

## 2018-10-07 NOTE — Assessment & Plan Note (Addendum)
Much better after 2 weeks Augmentin but sxs coming back so retreat Augmentin 875 mg bid x 14 d and then let me know if that does not hold her sxs

## 2018-10-07 NOTE — Assessment & Plan Note (Addendum)
Better w/ pelvic PT, SIBO Tx and probiotics Retreat w/ Augmentin Continue  PT and Home Exercise plan May resume probiotics after Abx tx but would take with vegetables (prebiotics) for maximal effect

## 2018-10-07 NOTE — Assessment & Plan Note (Addendum)
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 Landry Mellow [facility]

## 2018-10-15 ENCOUNTER — Encounter: Payer: Self-pay | Admitting: Physical Therapy

## 2018-10-15 ENCOUNTER — Ambulatory Visit: Payer: Medicare Other | Attending: Internal Medicine | Admitting: Physical Therapy

## 2018-10-15 DIAGNOSIS — M6281 Muscle weakness (generalized): Secondary | ICD-10-CM | POA: Insufficient documentation

## 2018-10-15 DIAGNOSIS — R109 Unspecified abdominal pain: Secondary | ICD-10-CM | POA: Insufficient documentation

## 2018-10-15 DIAGNOSIS — R279 Unspecified lack of coordination: Secondary | ICD-10-CM | POA: Diagnosis present

## 2018-10-15 NOTE — Therapy (Signed)
[facility] [address], [place], [place], [address] Phone: [phone]   Fax:  [phone]  Physical Therapy Treatment  Patient Details  Name: Deborah Conner MRN: [id number] Date of Birth: 03-30-1951 Referring Provider (PT): Dr. Silvano Rusk   Encounter Date: 10/15/2018  Progress Note Reporting Period 07/15/2019 to 10/15/2018  See note below for Objective Data and Assessment of Progress/Goals.       PT End of Session - 10/15/18 0810    Visit Number  10    Date for PT Re-Evaluation  11/23/18    Authorization Type  Medicare/BCBS    PT Start Time  0800    PT Stop Time  0840    PT Time Calculation (min)  40 min    Activity Tolerance  Patient tolerated treatment well;No increased pain    Behavior During Therapy  WFL for tasks assessed/performed       Past Medical History:  Diagnosis Date  . Anal fissure   . Anxiety and depression 1998/1973   some panic  . Aortic sclerosis 2005  . Diverticulosis 2003  . GERD (gastroesophageal reflux disease) 2001  . Gestational diabetes   . Glucose intolerance (impaired glucose tolerance) 2000  . Heart murmur   . HLD (hyperlipidemia)   . IBS (irritable bowel syndrome) 1998  . Insomnia 2003  . Internal hemorrhoids   . Lactose intolerance 1998  . Migraine headache childhood  . Osteoporosis 2010  . Polyarthralgia   . Rosacea 1975  . Seborrheic dermatitis age 66  . 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 Tinnitus 1999  . Uterine prolapse, 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    Past Surgical History:  Procedure Laterality Date  . COLONOSCOPY  2003, 04/11/2011   20012: diminutive polyp, diverticulosis, internal hemorrhoids  . UPPER GASTROINTESTINAL ENDOSCOPY  01/30/2006, 04/11/2011   2007 and 2012:Normal     There were no vitals filed for this visit.  Subjective Assessment - 10/15/18 0807    Subjective  I am going to see a urogyn doctor. Patient is back on her  antibiotic due to high hydrogen levels.     Patient Stated Goals  increase control of pelvic floor, improve quality of life     Currently in Pain?  Yes    Pain Score  5     Pain Location  Abdomen    Pain Orientation  Right;Upper    Pain Descriptors / Indicators  Dull    Pain Type  Acute pain    Pain Onset  More than a month ago    Pain Frequency  Intermittent    Aggravating Factors   after eating, after bowel movement    Pain Relieving Factors  getting up and moving around    Multiple Pain Sites  No         [facility] PT Assessment - 10/15/18 0001      Assessment   Medical Diagnosis  K58.2 irritable bowel syndrome with both constipation anddiarrhea; K30 Functional dyspepsia; n81.4 Uterine prolapse; N81.89 Pelvic floor weakness    Referring Provider (PT)  Dr. Silvano Rusk    Onset Date/Surgical Date  01/09/18    Prior Therapy  none      Precautions   Precautions  None      Restrictions   Weight Bearing Restrictions  No      Home Environment   Living Environment  Private residence      Prior Function   Level of Independence  Independent  Cognition   Overall Cognitive Status  Within Functional Limits for tasks assessed      Posture/Postural Control   Posture/Postural Control  Postural limitations    Postural Limitations  Rounded Shoulders;Forward head      AROM   Overall AROM   Within functional limits for tasks performed      Strength   Right Hip External Rotation   5/5    Left Hip External Rotation  5/5    Left Hip ABduction  4-/5      Flexibility   Soft Tissue Assessment /Muscle Length  yes    Quadriceps  tight bilaterally                Pelvic Floor Special Questions - 10/15/18 0001    Urinary Leakage  Yes        [facility] Adult PT Treatment/Exercise - 10/15/18 0001      Therapeutic Activites    Therapeutic Activities  Other Therapeutic Activities    Other Therapeutic Activities  correct toileting technique with diaphgram atic breathing and  relaxation of the pelvic floor      Neuro Re-ed    Neuro Re-ed Details   diaphragmatic breathing in sitting to expand abdomen after soft tissue work; sitting pelvic floor contraction lifting with  tactile cues from patients hands       Manual Therapy   Manual Therapy  Myofascial release    Myofascial Release  midline of abdomen, upper abdomin, along the xiphoid process, linea alba, along the lower abdomen working on the bladder area and umbilicus             PT Education - 10/15/18 1032    Education Details  pelvic floor contraction in sitting; diaphragmatic breathing    Person(s) Educated  Patient    Methods  Explanation;Demonstration;Verbal cues;Handout    Comprehension  Returned demonstration;Verbalized understanding       PT Short Term Goals - 10/15/18 0811      PT SHORT TERM GOAL #1   Title  independent with initial HEP    Time  4    Period  Weeks    Status  Achieved    Target Date  08/31/18      PT SHORT TERM GOAL #2   Title  understand vaginal health to reduce dryness    Time  4    Period  Weeks    Status  Achieved      PT SHORT TERM GOAL #3   Title  pain in right upper quadrant decreased >/= 25% due to improve posture and tissue mobility    Time  4    Period  Weeks    Status  Achieved      PT SHORT TERM GOAL #4   Title  ability to contract the pelvic floor for 8 seconds due to increased endurance    Time  4    Period  Weeks    Status  On-going        PT Long Term Goals - 10/15/18 1962      PT LONG TERM GOAL #1   Title  independent with HEP and understand how to progress her exercises    Baseline  still learning    Time  8    Status  On-going      PT LONG TERM GOAL #2   Title  pelvic floor strength >/= 3/5 holding for 10 seconds to reduce urinary leakage >/= 80%    Baseline  urinary leakage  improved by 90% but unable to hold 10 seconds, only holds 3-5 seconds in hookly    Time  8    Period  Weeks    Status  On-going      PT LONG TERM GOAL  #3   Title  right upper quadrant pain decreased >/= 80% due to improved tissue mobility and posture    Time  8    Period  Weeks    Status  Achieved      PT LONG TERM GOAL #4   Title  urge to urinate decreased >/= 60% so she is able to walk slowly to the bathroom    Time  8    Period  Weeks    Status  Achieved      PT LONG TERM GOAL #5   Title  urge to have a bowel movement when her IBS acts up decreased >/= 50% so she is able to walk to the bathroom slowly    Time  8    Period  Weeks    Status  Achieved            Plan - 10/15/18 0835    Clinical Impression Statement  Patient was able to work in the yard without her pessary and not have any urinary leakage. Patient continues to have right upper abdominal pain and continues to take antibiotics due to increased Hydrogen level. Patient continues to have fascial restrictions in the midine and lower abdominal area. After myofascial release there is improved tissue mobility and patient was able to perform diaphragmatic breathing with greater ease. Patient was able to demonstrate good return of toileting technique to not strain with bowel movement. Patient will benefit from skilled therapy to improve pelvic floor strength and improve abdominal strength.     Rehab Potential  Excellent    Clinical Impairments Affecting Rehab Potential  none    PT Frequency  1x / week    PT Duration  8 weeks    PT Treatment/Interventions  Biofeedback;Electrical Stimulation;Moist Heat;Cryotherapy;Ultrasound;Therapeutic activities;Therapeutic exercise;Neuromuscular re-education;Manual techniques;Patient/family education;Dry needling    PT Next Visit Plan  work on pelvic floor strength; internal soft tissue work    Oncologist with Plan of Care  Patient       Patient will benefit from skilled therapeutic intervention in order to improve the following deficits and impairments:  Increased fascial restricitons, Pain, Decreased coordination, Decreased  mobility, Increased muscle spasms, Decreased activity tolerance, Decreased strength  Visit Diagnosis: Muscle weakness (generalized)  Unspecified lack of coordination  Abdominal pain, unspecified abdominal location     Problem List Patient Active Problem List   Diagnosis Date Noted  . 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 Uterine prolapse, 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  . Menopausal symptoms 12/09/2011  . Pelvic pressure in female 12/09/2011  . Overweight(278.02) 12/09/2011  . Osteoporosis 12/09/2011  . Other dysphagia 02/24/2011  . Unspecified hemorrhoids with other complication 76/54/6503  . IBS (irritable bowel syndrome) 02/24/2011  . Lactose intolerance 02/24/2011    Earlie Counts, PT 10/15/18 10:48 AM   [facility] [address], [place], [place], [address] Phone: [phone]   Fax:  [phone]  Name: Deborah Conner MRN: [id number] Date of Birth: [birth year]

## 2018-10-15 NOTE — Patient Instructions (Addendum)
Sitting    Sit comfortably. Allow body's muscles to relax. Place hands on belly. Inhale slowly and deeply for _3__ seconds, so hands move out. Then take __3_ seconds to exhale. Repeat _5__ times. Do _2__ times a day.  Copyright  VHI. All rights reserved.  Slow Contraction: Gravity Resisted (Sitting)    Sitting, slowly squeeze pelvic floor for __10 sec_ seconds. Rest for _5__ seconds. Repeat __5_ times. Do __3_ times a day. Place hands on the pelvic floor muscles Copyright  VHI. All rights reserved.  [facility] [address] [place], [place] [address] Phone # [phone] Fax [phone]

## 2018-10-20 ENCOUNTER — Encounter: Payer: Self-pay | Admitting: Physical Therapy

## 2018-10-20 ENCOUNTER — Ambulatory Visit: Payer: Medicare Other | Admitting: Physical Therapy

## 2018-10-20 ENCOUNTER — Other Ambulatory Visit: Payer: Self-pay

## 2018-10-20 DIAGNOSIS — R279 Unspecified lack of coordination: Secondary | ICD-10-CM

## 2018-10-20 DIAGNOSIS — M6281 Muscle weakness (generalized): Secondary | ICD-10-CM | POA: Diagnosis not present

## 2018-10-20 DIAGNOSIS — R109 Unspecified abdominal pain: Secondary | ICD-10-CM

## 2018-10-20 NOTE — Patient Instructions (Signed)
Bracing With Arm / Leg Raise (Quadruped)    On hands and knees find neutral spine. Tighten pelvic floor and abdominals and hold. Alternating, lift arm to shoulder level and opposite leg to hip level. Repeat _10__ times. Do _1__ times a day.   Copyright  VHI. All rights reserved.  Bracing With Leg Lift (Prone)    With pillow support, lie on abdomen and neutral spine, tighten pelvic floor and abdominals and hold. Alternately raise legs off floor. Repeat _10__ times. Do _1__ times a day.   Copyright  VHI. All rights reserved.  Bracing With Bridging (Hook-Lying)    With neutral spine, tighten pelvic floor and abdominals and hold 5 sec.Marland Kitchen Lift bottom. Repeat _10__ times. Do _1__ times a day.  Fully straighten you hips.  Copyright  VHI. All rights reserved.  HIP: Adduction - Side-Lying    Lie on side with top leg crossed in front of bottom leg. Raise bottom leg, keep knee straight. _10__ reps per set, __1_ sets per day, __1_ days per week   Copyright  VHI. All rights reserved.  [facility] [address], [place], [place] [address] Phone # [phone] Fax [phone]

## 2018-10-20 NOTE — Therapy (Signed)
[facility] [address] [place], [place], [address] Phone: [phone]   Fax:  [phone]  Physical Therapy Treatment  Patient Details  Name: Deborah Conner MRN: [id number] Date of Birth: 20-Dec-1950 Referring Provider (PT): Dr. Silvano Rusk   Encounter Date: 10/20/2018  PT End of Session - 10/20/18 1022    Visit Number  11    Date for PT Re-Evaluation  11/23/18    Authorization Type  Medicare/BCBS    PT Start Time  5643    PT Stop Time  1055    PT Time Calculation (min)  40 min    Activity Tolerance  Patient tolerated treatment well;No increased pain    Behavior During Therapy  WFL for tasks assessed/performed       Past Medical History:  Diagnosis Date  . Anal fissure   . Anxiety and depression 1998/1973   some panic  . Aortic sclerosis 2005  . Diverticulosis 2003  . GERD (gastroesophageal reflux disease) 2001  . Gestational diabetes   . Glucose intolerance (impaired glucose tolerance) 2000  . Heart murmur   . HLD (hyperlipidemia)   . IBS (irritable bowel syndrome) 1998  . Insomnia 2003  . Internal hemorrhoids   . Lactose intolerance 1998  . Migraine headache childhood  . Osteoporosis 2010  . Polyarthralgia   . Rosacea 1975  . Seborrheic dermatitis age 70  . 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 Tinnitus 1999  . Uterine prolapse, 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    Past Surgical History:  Procedure Laterality Date  . COLONOSCOPY  2003, 04/11/2011   [address]12: diminutive polyp, diverticulosis, internal hemorrhoids  . UPPER GASTROINTESTINAL ENDOSCOPY  01/30/2006, 04/11/2011   2007 and 2012:Normal     There were no vitals filed for this visit.  Subjective Assessment - 10/20/18 1020    Subjective  I have not had as much abdominal pain. I see the urogyn doctor 3/19. No abdominal pain from yesterday to today.     Patient Stated Goals  increase control of pelvic floor, improve quality of life      Currently in Pain?  No/denies                       [facility] Adult PT Treatment/Exercise - 10/20/18 0001      Neuro Re-ed    Neuro Re-ed Details   diaphragmatic breathing in sitting to expand abdomen with no tactile cues      Lumbar Exercises: Quadruped   Opposite Arm/Leg Raise  Right arm/Left leg;Left arm/Right leg;15 reps;1 second      Knee/Hip Exercises: Sidelying   Hip ABduction  Strengthening;Right;Left;1 set;10 reps    Hip ADduction  Strengthening;Right;Left;1 set;10 reps      Knee/Hip Exercises: Prone   Hip Extension  Strengthening;Right;Left;10 reps;2 sets      Manual Therapy   Manual Therapy  Myofascial release    Myofascial Release  upper abdominal area             PT Education - 10/20/18 1052    Education Details  hip and core strength    Person(s) Educated  Patient    Methods  Explanation;Demonstration;Verbal cues;Handout    Comprehension  Returned demonstration;Verbalized understanding       PT Short Term Goals - 10/15/18 3295      PT SHORT TERM GOAL #1   Title  independent with initial HEP    Time  4    Period  Weeks  Status  Achieved    Target Date  08/31/18      PT SHORT TERM GOAL #2   Title  understand vaginal health to reduce dryness    Time  4    Period  Weeks    Status  Achieved      PT SHORT TERM GOAL #3   Title  pain in right upper quadrant decreased >/= 25% due to improve posture and tissue mobility    Time  4    Period  Weeks    Status  Achieved      PT SHORT TERM GOAL #4   Title  ability to contract the pelvic floor for 8 seconds due to increased endurance    Time  4    Period  Weeks    Status  On-going        PT Long Term Goals - 10/20/18 1022      PT LONG TERM GOAL #1   Title  independent with HEP and understand how to progress her exercises    Baseline  still learning    Time  8    Period  Weeks    Status  On-going      PT LONG TERM GOAL #2   Title  pelvic floor strength >/= 3/5 holding for 10  seconds to reduce urinary leakage >/= 80%    Baseline  small drops of leakage 2 times, able to get to the bathroom,     Time  8    Period  Weeks    Status  On-going      PT LONG TERM GOAL #3   Title  right upper quadrant pain decreased >/= 80% due to improved tissue mobility and posture    Baseline  --    Time  8    Period  Weeks    Status  Achieved            Plan - 10/20/18 1035    Clinical Impression Statement  Improved mobility of the abdominal fascia. Patient continues to have difficulty exercising the pelvic floor with the pessary in. Patient has not had abdominal pain for 2 days. Patient is learning core exercises. Patient will benefit from skilled therapy to improve pelvic floor strength and improve abdominal strength.     Rehab Potential  Excellent    Clinical Impairments Affecting Rehab Potential  none    PT Frequency  1x / week    PT Duration  8 weeks    PT Treatment/Interventions  Biofeedback;Electrical Stimulation;Moist Heat;Cryotherapy;Ultrasound;Therapeutic activities;Therapeutic exercise;Neuromuscular re-education;Manual techniques;Patient/family education;Dry needling    PT Next Visit Plan  if patient is doing well then discharge    Consulted and Agree with Plan of Care  Patient       Patient will benefit from skilled therapeutic intervention in order to improve the following deficits and impairments:  Increased fascial restricitons, Pain, Decreased coordination, Decreased mobility, Increased muscle spasms, Decreased activity tolerance, Decreased strength  Visit Diagnosis: Muscle weakness (generalized)  Unspecified lack of coordination  Abdominal pain, unspecified abdominal location     Problem List Patient Active Problem List   Diagnosis Date Noted  . 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 Uterine prolapse, 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  . Menopausal symptoms 12/09/2011  . Pelvic pressure in female 12/09/2011  . Overweight(278.02) 12/09/2011   . Osteoporosis 12/09/2011  . Other dysphagia 02/24/2011  . Unspecified hemorrhoids with other complication 40/98/1191  . IBS (irritable bowel syndrome) 02/24/2011  . Lactose intolerance 02/24/2011  Earlie Counts, PT 10/20/18 10:58 AM   [facility] [address] [place], [place], [address] Phone: [phone]   Fax:  [phone]  Name: Deborah Conner MRN: [id number] Date of Birth: [birth year]

## 2018-10-26 ENCOUNTER — Ambulatory Visit: Payer: Medicare Other | Admitting: Physical Therapy

## 2018-11-02 ENCOUNTER — Encounter: Payer: Medicare Other | Admitting: Physical Therapy

## 2018-11-09 ENCOUNTER — Encounter: Payer: Medicare Other | Admitting: Physical Therapy

## 2018-11-16 ENCOUNTER — Encounter: Payer: Medicare Other | Admitting: Physical Therapy

## 2018-11-17 ENCOUNTER — Ambulatory Visit: Payer: Medicare Other

## 2018-11-22 ENCOUNTER — Telehealth: Payer: Self-pay | Admitting: Physical Therapy

## 2018-11-22 NOTE — Telephone Encounter (Signed)
Not having right upper quadrant pain. I am able to go 3 days without the pessary. Patient will like to see therapist when we are open full schedule.  Earlie Counts, PT @4 /13/2020@ 10:48 AM

## 2018-11-23 ENCOUNTER — Encounter: Payer: Medicare Other | Admitting: Physical Therapy

## 2018-12-10 ENCOUNTER — Telehealth: Payer: Self-pay | Admitting: Internal Medicine

## 2018-12-10 NOTE — Telephone Encounter (Signed)
Patient report right sided pain has returned some.  She completed the augment in for SIBO approximately a month and a half ago.  She started to really notice the pain return about 2 weeks ago.  Dr. Carlean Purl do you want to retreat with Augment.  She is aware that it will be next week before we get an answer.

## 2018-12-14 MED ORDER — METRONIDAZOLE 250 MG PO TABS: 250.0000 mg | ORAL_TABLET | Freq: Three times a day (TID) | ORAL | 0 refills | Status: DC

## 2018-12-14 NOTE — Telephone Encounter (Signed)
I have sent an Rx for metronidazole 250 mg tid x 10 days - time to change up the abx Please let her know

## 2018-12-14 NOTE — Telephone Encounter (Signed)
Left message for patient to call back  

## 2018-12-14 NOTE — Telephone Encounter (Signed)
Spoke with patient to inform her of the new Rx that was sent to her pharmacy. No questions or needs discussed by the patient.

## 2018-12-14 NOTE — Telephone Encounter (Signed)
Patient is returning your call.  

## 2018-12-23 ENCOUNTER — Ambulatory Visit: Payer: Medicare Other

## 2018-12-29 ENCOUNTER — Ambulatory Visit: Payer: Medicare Other

## 2019-01-06 ENCOUNTER — Encounter: Payer: Self-pay | Admitting: Physical Therapy

## 2019-01-20 ENCOUNTER — Other Ambulatory Visit: Payer: Self-pay

## 2019-01-20 ENCOUNTER — Encounter: Payer: Self-pay | Admitting: Physical Therapy

## 2019-01-20 ENCOUNTER — Ambulatory Visit: Payer: Medicare Other | Attending: Internal Medicine | Admitting: Physical Therapy

## 2019-01-20 DIAGNOSIS — R109 Unspecified abdominal pain: Secondary | ICD-10-CM | POA: Insufficient documentation

## 2019-01-20 DIAGNOSIS — M6281 Muscle weakness (generalized): Secondary | ICD-10-CM | POA: Insufficient documentation

## 2019-01-20 DIAGNOSIS — R279 Unspecified lack of coordination: Secondary | ICD-10-CM | POA: Diagnosis present

## 2019-01-20 NOTE — Patient Instructions (Addendum)
Quick Contraction: Gravity Resisted (Sitting)    Sitting, quickly squeeze anus then fully relax anus. Perform __1_ sets of __5_. Rest for __1_ seconds between sets. Do _3__ times a day.  Copyright  VHI. All rights reserved.  Quick Contraction: Gravity Resisted (Sitting)    Sitting, quickly squeeze then fully relax pelvic floor. Perform ___ sets of ___. Rest for ___ seconds between sets. Do ___ times a day.  Copyright  VHI. All rights reserved.  Slow Contraction: Gravity Resisted (Sitting)    Sitting, slowly squeeze anus for _8__ seconds. Rest for _5__ seconds. Repeat __5_ times. Do _3__ times a day.  Copyright  VHI. All rights reserved.    Quick Contraction: Gravity Resisted (Sitting)    Sitting, quickly squeeze vagina then fully relax vagina. Perform _1__ sets of _5__. Rest for __1_ seconds between sets. Do __3_ times a day.  Copyright  VHI. All rights reserved.  Slow Contraction: Gravity Resisted (Sitting)    Sitting, slowly squeeze vagina for __8_ seconds. Rest for __5_ seconds. Repeat _5__ times. Do _3__ times a day.  Copyright  VHI. All rights reserved.  [facility] [address] [place], [place] [address] Phone # [phone] Fax [phone]

## 2019-01-20 NOTE — Therapy (Signed)
[facility] [address], [place], [place], [address] Phone: [phone]   Fax:  [phone]  Physical Therapy Treatment Renewal/discharge  Patient Details  Name: Deborah Conner MRN: [id number] Date of Birth: 12-15-1950 Referring Provider (PT): Dr. Silvano Rusk   Encounter Date: 01/20/2019  PT End of Session - 01/20/19 0944    Visit Number  12    Date for PT Re-Evaluation  01/20/19    Authorization Type  Medicare/BCBS    PT Start Time  0909   came late   PT Stop Time  0940    PT Time Calculation (min)  31 min    Activity Tolerance  Patient tolerated treatment well;No increased pain    Behavior During Therapy  WFL for tasks assessed/performed       Past Medical History:  Diagnosis Date  . Anal fissure   . Anxiety and depression 1998/1973   some panic  . Aortic sclerosis 2005  . Diverticulosis 2003  . GERD (gastroesophageal reflux disease) 2001  . Gestational diabetes   . Glucose intolerance (impaired glucose tolerance) 2000  . Heart murmur   . HLD (hyperlipidemia)   . IBS (irritable bowel syndrome) 1998  . Insomnia 2003  . Internal hemorrhoids   . Lactose intolerance 1998  . Migraine headache childhood  . Osteoporosis 2010  . Polyarthralgia   . Rosacea 1975  . Seborrheic dermatitis age 34  . 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 Tinnitus 1999  . Uterine prolapse, 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    Past Surgical History:  Procedure Laterality Date  . COLONOSCOPY  2003, 04/11/2011   20012: diminutive polyp, diverticulosis, internal hemorrhoids  . UPPER GASTROINTESTINAL ENDOSCOPY  01/30/2006, 04/11/2011   2007 and 2012:Normal     There were no vitals filed for this visit.  Subjective Assessment - 01/20/19 0913    Subjective  My abdominal pain is pretty good. I had a flare-up in April and it is better. I see the urogynecologist in July. I feel the rectum is loose. The exercises help me have a  bowel movement. I am not holding the gas back as much as I used to. I pass gas without intending to.  No trouble to get to the bathroom when I have to urinate.    Patient Stated Goals  increase control of pelvic floor, improve quality of life     Currently in Pain?  No/denies    Multiple Pain Sites  No         [facility] PT Assessment - 01/20/19 0001      Assessment   Medical Diagnosis  K58.2 irritable bowel syndrome with both constipation anddiarrhea; K30 Functional dyspepsia; n81.4 Uterine prolapse; N81.89 Pelvic floor weakness    Referring Provider (PT)  Dr. Silvano Rusk    Onset Date/Surgical Date  01/09/18    Prior Therapy  none      Precautions   Precautions  None      Restrictions   Weight Bearing Restrictions  No      [place] residence      Prior Function   Level of Independence  Independent      Cognition   Overall Cognitive Status  Within Functional Limits for tasks assessed      Posture/Postural Control   Posture/Postural Control  Postural limitations      AROM   Overall AROM   Within functional limits for tasks performed      Strength  Right Hip External Rotation   5/5    Left Hip External Rotation  5/5    Left Hip ABduction  4+/5                Pelvic Floor Special Questions - 01/20/19 0001    Urinary Leakage  No    Fecal incontinence  No    Pelvic Floor Internal Exam  Patient confirms identification and approves PT to assess pelvic floor and treatment    Exam Type  Vaginal    Palpation  tightness on right levator ani    Strength  fair squeeze, definite lift    Strength # of seconds  8        [facility] Adult PT Treatment/Exercise - 01/20/19 0001      Neuro Re-ed    Neuro Re-ed Details   pelvic floor exercise for vagina and anus in supine and sitting. while in supine used tactile cues to stimulate the levator ani for anal contraction       Exercises   Exercises  Other Exercises    Other Exercises    reviewed past exercises and patient reports she is not having issues with them      Manual Therapy   Manual Therapy  Internal Pelvic Floor    Internal Pelvic Floor  bil. levator ani and introitus             PT Education - 01/20/19 0941    Education Details  pelvic floor exericses in sitting for the anus and vagina    Person(s) Educated  Patient    Methods  Explanation;Demonstration;Handout    Comprehension  Verbalized understanding;Returned demonstration       PT Short Term Goals - 01/20/19 0950      PT SHORT TERM GOAL #1   Title  independent with initial HEP    Time  4    Period  Weeks    Status  Achieved      PT SHORT TERM GOAL #2   Title  understand vaginal health to reduce dryness    Time  4    Period  Weeks    Status  Achieved      PT SHORT TERM GOAL #3   Title  pain in right upper quadrant decreased >/= 25% due to improve posture and tissue mobility    Time  4    Period  Weeks    Status  Achieved      PT SHORT TERM GOAL #4   Title  ability to contract the pelvic floor for 8 seconds due to increased endurance    Time  4    Period  Weeks    Status  Achieved        PT Long Term Goals - 01/20/19 4098      PT LONG TERM GOAL #1   Title  independent with HEP and understand how to progress her exercises    Time  8    Period  Weeks    Status  On-going      PT LONG TERM GOAL #2   Title  pelvic floor strength >/= 3/5 holding for 10 seconds to reduce urinary leakage >/= 80%    Time  8    Period  Weeks    Status  Achieved      PT LONG TERM GOAL #3   Title  right upper quadrant pain decreased >/= 80% due to improved tissue mobility and posture    Time  8  Period  Weeks    Status  Achieved      PT LONG TERM GOAL #4   Title  urge to urinate decreased >/= 60% so she is able to walk slowly to the bathroom    Time  8    Period  Weeks    Status  Achieved      PT LONG TERM GOAL #5   Title  urge to have a bowel movement when her IBS acts up decreased  >/= 50% so she is able to walk to the bathroom slowly    Time  8    Period  Weeks    Status  Achieved            Plan - 01/20/19 0945    Clinical Impression Statement  Patient has not attended therapy since 10/20/2018 due to [place]. Today is follow up to see if patient needs to continue therapy. Patient has met all of her goals. When she has abdominal pain it is helped with antibiotics and probiotics. Patient is not leaking urine. Patient is able to get to the bathroom in time in the morning walking slowly. Patient is seeing a urogynecologist in July for follow up on her prolapse. Patient reports she sometimes has difficulty with releasing gas slowly. Patient reports she is ready for discharge.    Rehab Potential  Excellent    Clinical Impairments Affecting Rehab Potential  none    PT Frequency  One time visit    PT Duration  --   for follow up and review of HEP and for discharge   PT Treatment/Interventions  Biofeedback;Electrical Stimulation;Moist Heat;Cryotherapy;Ultrasound;Therapeutic activities;Therapeutic exercise;Neuromuscular re-education;Manual techniques;Patient/family education;Dry needling    PT Next Visit Plan  added to HEP today and discharged today    PT Home Exercise Plan  current HEP    Recommended Other Services  sent MD renewal/discharge note    Consulted and Agree with Plan of Care  Patient       Patient will benefit from skilled therapeutic intervention in order to improve the following deficits and impairments:  Increased fascial restricitons, Pain, Decreased coordination, Decreased mobility, Increased muscle spasms, Decreased activity tolerance, Decreased strength  Visit Diagnosis: Muscle weakness (generalized) - Plan: PT plan of care cert/re-cert  Unspecified lack of coordination - Plan: PT plan of care cert/re-cert  Abdominal pain, unspecified abdominal location - Plan: PT plan of care cert/re-cert     Problem List Patient Active Problem List    Diagnosis Date Noted  . 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 Uterine prolapse, 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  . Menopausal symptoms 12/09/2011  . Pelvic pressure in female 12/09/2011  . Overweight(278.02) 12/09/2011  . Osteoporosis 12/09/2011  . Other dysphagia 02/24/2011  . Unspecified hemorrhoids with other complication 30/86/5784  . IBS (irritable bowel syndrome) 02/24/2011  . Lactose intolerance 02/24/2011    Earlie Counts, PT 01/20/19 9:54 AM ' [facility] [address] [place], [place], [address] Phone: [phone]   Fax:  [phone]  Name: Deborah Conner MRN: [id number] Date of Birth: 08-25-1950  PHYSICAL THERAPY DISCHARGE SUMMARY  Visits from Start of Care: 12  Current functional level related to goals / functional outcomes: See above.    Remaining deficits: See above.    Education / Equipment: HEP Plan: Patient agrees to discharge.  Patient goals were met. Patient is being discharged due to meeting the stated rehab goals.  Thank you for the referral. Earlie Counts, PT 01/20/19 9:55 AM  ?????

## 2019-02-14 ENCOUNTER — Ambulatory Visit
Admission: RE | Admit: 2019-02-14 | Discharge: 2019-02-14 | Disposition: A | Discharge status: Home / Self Care | Payer: Medicare Other | Source: Ambulatory Visit | Attending: Family Medicine | Admitting: Family Medicine

## 2019-02-14 ENCOUNTER — Other Ambulatory Visit: Payer: Self-pay

## 2019-02-14 DIAGNOSIS — Z1231 Encounter for screening mammogram for malignant neoplasm of breast: Secondary | ICD-10-CM

## 2019-05-27 DIAGNOSIS — B009 Herpesviral infection, unspecified: Secondary | ICD-10-CM | POA: Insufficient documentation

## 2019-05-27 DIAGNOSIS — R109 Unspecified abdominal pain: Secondary | ICD-10-CM | POA: Insufficient documentation

## 2019-05-27 DIAGNOSIS — R300 Dysuria: Secondary | ICD-10-CM | POA: Insufficient documentation

## 2019-05-27 DIAGNOSIS — D280 Benign neoplasm of vulva: Secondary | ICD-10-CM | POA: Insufficient documentation

## 2019-05-27 DIAGNOSIS — N9089 Other specified noninflammatory disorders of vulva and perineum: Secondary | ICD-10-CM | POA: Insufficient documentation

## 2019-06-07 ENCOUNTER — Telehealth: Payer: Self-pay | Admitting: Internal Medicine

## 2019-06-07 NOTE — Telephone Encounter (Signed)
Nothing more at this time

## 2019-06-07 NOTE — Telephone Encounter (Signed)
Pt states that she has been experiencing pain on the upper side of her abdomen yesterday and today, she would like some advise on what to take.

## 2019-06-07 NOTE — Telephone Encounter (Signed)
Patient reports 2 days of RUQ pain.  She reports that pain is similar to [place]burn but on her right side.  She is asking if she can try OTC antacids "gaviscon". She is scheduled to see you Thursday at 3:40.  She is advised to try OTC antacids until office visit.  Do you have any additional recommendations?

## 2019-06-08 NOTE — Telephone Encounter (Signed)
Patient notified

## 2019-06-09 ENCOUNTER — Other Ambulatory Visit: Payer: Self-pay

## 2019-06-09 ENCOUNTER — Encounter: Payer: Self-pay | Admitting: Internal Medicine

## 2019-06-09 ENCOUNTER — Ambulatory Visit (INDEPENDENT_AMBULATORY_CARE_PROVIDER_SITE_OTHER): Payer: Medicare Other | Admitting: Internal Medicine

## 2019-06-09 VITALS — BP 132/88 | HR 92 | Temp 98.0°F | Ht 66.0 in | Wt 174.1 lb

## 2019-06-09 DIAGNOSIS — K580 Irritable bowel syndrome with diarrhea: Secondary | ICD-10-CM

## 2019-06-09 DIAGNOSIS — K6389 Other specified diseases of intestine: Secondary | ICD-10-CM

## 2019-06-09 DIAGNOSIS — F418 Other specified anxiety disorders: Secondary | ICD-10-CM

## 2019-06-09 DIAGNOSIS — N814 Uterovaginal prolapse, unspecified: Secondary | ICD-10-CM | POA: Diagnosis not present

## 2019-06-09 MED ORDER — XIFAXAN 550 MG PO TABS: 550.0000 mg | ORAL_TABLET | Freq: Three times a day (TID) | ORAL | 0 refills | Status: AC

## 2019-06-09 NOTE — Patient Instructions (Signed)
We have sent the following medications to your pharmacy for you to pick up at your convenience: xifaxan  If this is too expensive call and let us know and we will prescribe something else.   Follow up as needed with Dr Carlean Purl.    I appreciate the opportunity to care for you. Silvano Rusk, MD, [facility]

## 2019-06-09 NOTE — Assessment & Plan Note (Signed)
Eventual surgical repair and repair of rectocele

## 2019-06-09 NOTE — Assessment & Plan Note (Signed)
I encouraged her to continue to mask and socially distance but that getting outside and exercising is a very safe activity.  Outside in the daylight in the sunshine etc. when possible as far better than staying at home.  I explained how the biggest risks are from close contact less than 6 feet without masks for a longer period of times.  She has greatly restricted interaction with family members.

## 2019-06-09 NOTE — Progress Notes (Signed)
Deborah Conner 68 y.o. 06-06-1951 [id number]  Assessment & Plan:   IBS (irritable bowel syndrome) Improved since last visit but deteriorated some.  Pelvic floor physical therapy has helped tremendously.  Treat with Xifaxan she also has small intestinal bacterial overgrowth.  If that is cost prohibitive try Augmentin again.  Some of her issues are related to her rectocele and uterine prolapse and she anticipates surgical repair at some point though replacement of pessary has helped.  Uterine prolapse, Pessary tx Eventual surgical repair and repair of rectocele  Small intestinal bacterial overgrowth She has been treated twice with Augmentin, treat with Xifaxan if affordable  Anxiety about health in pandemic I encouraged her to continue to mask and socially distance but that getting outside and exercising is a very safe activity.  Outside in the daylight in the sunshine etc. when possible as far better than staying at home.  I explained how the biggest risks are from close contact less than 6 feet without masks for a longer period of times.  She has greatly restricted interaction with family members.   CC: Derinda Late, MD   Subjective:   Chief Complaint: Right upper quadrant pain and IBS  HPI The patient called in the other day was having some right upper quadrant pressure like she has had in the past.  She had tried some Gaviscon and it helped and we told her it was okay to continue to use that.  She has a history of IBS with urgent stools and diarrhea which is better after pelvic floor physical therapy.  She has been seeing Dr. Landry Mellow of [facility] urogynecology and is eventually going to have uterine prolapse and rectocele repair but because of the pandemic wants to wait until it is "clear".  She has an appointment for next April.  The pessary is back in it was taken out and she had some incontinence issues with her feces with that only one time but it was thought that  even though because her uterus is retroverted the pessary is not the best long-term treatment approach it was reasonable to use it for now.  This is based upon what I hear from the patient.  In the past she has responded well to Augmentin though it took 2 rounds in late 2019 and February 2020.  Bloating gaseousness urgent loose defecation had improved.  She does have occasional skipped bowel movements now. Allergies  Allergen Reactions  . Other Itching  . Sulfa Antibiotics Itching  . Sulfasalazine Itching  . Lactose   . Lactose Intolerance (Gi)   . Chlorthalidone Rash   Current Meds  Medication Sig  . acyclovir ointment (ZOVIRAX) 5 % as needed  . ALPRAZolam (XANAX) 0.25 MG tablet Take 0.125 mg by mouth as needed.   . betamethasone valerate ointment (VALISONE) 0.1 % betamethasone valerate 0.1 % topical ointment  . clobetasol ointment (TEMOVATE) 0.05 % APPLY TO SCALP EVERY OTHER DAY AS NEEDED FOR ITCHIN NOT TO FACE  . desonide (DESOWEN) 0.05 % cream apply to FACE twice a day for 2 weeks then daily if needed for itching  . FLUOCINOLONE ACETONIDE SCALP 0.01 % OIL   . fluocinonide (LIDEX) 0.05 % external solution fluocinonide 0.05 % topical solution  . FLUoxetine (PROZAC) 10 MG tablet Take 10 mg by mouth daily.  . furosemide (LASIX) 20 MG tablet Take 20 mg by mouth every other day.   . metFORMIN (GLUCOPHAGE-XR) 500 MG 24 hr tablet metformin ER 500 mg tablet,extended release 24 hr  TK 1 T PO BID PC  . Multiple Vitamin (MULTIVITAMIN) tablet Take 1 tablet by mouth daily. PURE ENCAPSULATIONS  . Polyethyl Glycol-Propyl Glycol (SYSTANE OP) Place into both eyes as needed.    . pravastatin (PRAVACHOL) 10 MG tablet pravastatin 10 mg tablet  TK 1 T PO Q NIGHT PC  . PRESCRIPTION MEDICATION CREAMS FOR SEBORRHEIC DERMATITIS  . UNABLE TO FIND fluocinolone 0.01 % scalp oil and shower cap  . valACYclovir (VALTREX) 500 MG tablet as needed.   Marland Kitchen VITAMIN D PO Take 500 mg by mouth daily.   Past Medical  History:  Diagnosis Date  . Anal fissure   . Anxiety and depression 1998/1973   some panic  . Aortic sclerosis 2005  . Diverticulosis 2003  . GERD (gastroesophageal reflux disease) 2001  . Gestational diabetes   . Glucose intolerance (impaired glucose tolerance) 2000  . Heart murmur   . HLD (hyperlipidemia)   . IBS (irritable bowel syndrome) 1998  . Insomnia 2003  . Internal hemorrhoids   . Lactose intolerance 1998  . Migraine headache childhood  . Osteoporosis 2010  . Polyarthralgia   . Rosacea 1975  . Seborrheic dermatitis age 74  . 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 Tinnitus 1999  . Uterine prolapse, 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   Past Surgical History:  Procedure Laterality Date  . COLONOSCOPY  2003, 04/11/2011   20012: diminutive polyp, diverticulosis, internal hemorrhoids  . 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   Review of Systems  As per HPI has been concerned about the pandemic which is caused anxiety.  She has not been going to the park for fear of getting Covid 19  Objective:   Physical Exam BP 132/88 (BP Location: Left Arm, Patient Position: Sitting, Cuff Size: Normal)   Pulse 92   Temp 98 F (36.7 C) (Oral)   Ht 5\' 6"  (1.676 m)   Wt 174 lb 2 oz (79 kg)   BMI 28.10 kg/m    15 minutes time spent with patient > half in counseling coordination of care

## 2019-06-09 NOTE — Assessment & Plan Note (Addendum)
Improved since last visit but deteriorated some.  Pelvic floor physical therapy has helped tremendously.  Treat with Xifaxan she also has small intestinal bacterial overgrowth.  If that is cost prohibitive try Augmentin again.  Some of her issues are related to her rectocele and uterine prolapse and she anticipates surgical repair at some point though replacement of pessary has helped.

## 2019-06-09 NOTE — Assessment & Plan Note (Signed)
She has been treated twice with Augmentin, treat with Xifaxan if affordable

## 2019-06-10 ENCOUNTER — Telehealth: Payer: Self-pay

## 2019-06-10 NOTE — Telephone Encounter (Signed)
Patient's pharmacy wanted $1900.00 for her xifaxan. I was able to get the rep to give me a course for her. She was very thankful and she is aware it's up front for pick up.

## 2020-01-31 ENCOUNTER — Other Ambulatory Visit: Payer: Self-pay | Admitting: Family Medicine

## 2020-01-31 DIAGNOSIS — Z1231 Encounter for screening mammogram for malignant neoplasm of breast: Secondary | ICD-10-CM

## 2020-02-16 ENCOUNTER — Ambulatory Visit
Admission: RE | Admit: 2020-02-16 | Discharge: 2020-02-16 | Disposition: A | Discharge status: Home / Self Care | Payer: Medicare Other | Source: Ambulatory Visit | Attending: Family Medicine | Admitting: Family Medicine

## 2020-02-16 ENCOUNTER — Other Ambulatory Visit: Payer: Self-pay

## 2020-02-16 DIAGNOSIS — Z1231 Encounter for screening mammogram for malignant neoplasm of breast: Secondary | ICD-10-CM

## 2020-05-15 ENCOUNTER — Ambulatory Visit: Payer: Medicare Other | Attending: Internal Medicine

## 2020-05-15 DIAGNOSIS — Z23 Encounter for immunization: Secondary | ICD-10-CM

## 2020-05-15 NOTE — Progress Notes (Signed)
   Covid-19 Vaccination Clinic  Name:  Deborah Conner    MRN: [id number] DOB: 20-Jul-1951  05/15/2020  [name] was observed post Covid-19 immunization for 15 minutes without incident. She was provided with Vaccine Information Sheet and instruction to access the V-Safe system.   [name] was instructed to call 911 with any severe reactions post vaccine: Marland Kitchen Difficulty breathing  . Swelling of face and throat  . A fast heartbeat  . A bad rash all over body  . Dizziness and weakness

## 2020-05-28 ENCOUNTER — Telehealth: Payer: Self-pay | Admitting: Internal Medicine

## 2020-05-28 NOTE — Telephone Encounter (Signed)
Take the Xifaxan again and update me if it does not work - give at least 2 weeks after completion to determine

## 2020-05-28 NOTE — Telephone Encounter (Signed)
Left message for patient to call back  

## 2020-05-28 NOTE — Telephone Encounter (Signed)
Pt is requesting a call back from a nurse, pt states she experiencing left abdominal pain. Pt would like some advice on what she can do.

## 2020-05-28 NOTE — Telephone Encounter (Signed)
Patient reports last year she was treated for presumed SIBO.  She was given samples and she picked up rx from the pharmacy.  She has it at home still.  Her symptoms have all returned of bloating, gas, and urgent BM for the last several weeks.  She is asking if she should take the round of Xifaxan she has at home or would you like her to come in for an office visit?

## 2020-05-28 NOTE — Telephone Encounter (Signed)
Patient notified

## 2020-12-12 DIAGNOSIS — R519 Headache, unspecified: Secondary | ICD-10-CM | POA: Insufficient documentation

## 2020-12-14 DIAGNOSIS — R700 Elevated erythrocyte sedimentation rate: Secondary | ICD-10-CM | POA: Insufficient documentation

## 2020-12-28 ENCOUNTER — Other Ambulatory Visit: Payer: Self-pay | Admitting: Rheumatology

## 2020-12-28 DIAGNOSIS — M316 Other giant cell arteritis: Secondary | ICD-10-CM

## 2020-12-30 ENCOUNTER — Telehealth: Payer: Self-pay | Admitting: Adult Health

## 2020-12-30 NOTE — Telephone Encounter (Signed)
I called patient to discuss Evusheld, a long acting monoclonal antibody injection administered to patients with decreased immune systems or intolerance/allergy to the COVID 19 vaccine as COVID19 prevention.    Unable to reach patient.  Unable to leave voice mail.    Keeanna Villafranca, NP  

## 2021-01-02 ENCOUNTER — Telehealth: Payer: Self-pay | Admitting: Adult Health

## 2021-01-02 NOTE — Telephone Encounter (Signed)
I called patient to discuss Evusheld, a long acting monoclonal antibody injection administered to patients with decreased immune systems or intolerance/allergy to the COVID 19 vaccine as COVID19 prevention.    Reviewed with patient.  She would like to think aobut it and will call me back if she decides to proceed.   Wilber Bihari, NP

## 2021-01-14 ENCOUNTER — Other Ambulatory Visit: Payer: Self-pay | Admitting: Family Medicine

## 2021-01-14 DIAGNOSIS — Z1231 Encounter for screening mammogram for malignant neoplasm of breast: Secondary | ICD-10-CM

## 2021-01-17 ENCOUNTER — Ambulatory Visit
Admission: RE | Admit: 2021-01-17 | Discharge: 2021-01-17 | Disposition: A | Discharge status: Home / Self Care | Payer: Medicare Other | Source: Ambulatory Visit | Attending: Rheumatology | Admitting: Rheumatology

## 2021-01-17 DIAGNOSIS — M316 Other giant cell arteritis: Secondary | ICD-10-CM

## 2021-01-17 MED ORDER — IOPAMIDOL (ISOVUE-370) INJECTION 76%
75.0000 mL | Freq: Once | INTRAVENOUS | Status: AC | PRN
  Administered 2021-01-17: 75 mL via INTRAVENOUS

## 2021-02-01 ENCOUNTER — Telehealth: Payer: Self-pay | Admitting: Internal Medicine

## 2021-02-01 DIAGNOSIS — R1310 Dysphagia, unspecified: Secondary | ICD-10-CM

## 2021-02-01 NOTE — Telephone Encounter (Signed)
Pt states that she has been on a heavy does of steroid that has given her some swallowing issues. Her Rheumatologist suggested that pt check with Dr. Carlean Purl the possibility to increase her Protonix to see it that helps. Pls call pt.

## 2021-02-01 NOTE — Telephone Encounter (Signed)
Returned patient's call. Got voice mail. Left message to please call back

## 2021-02-05 NOTE — Telephone Encounter (Signed)
She needs an EGD to evaluate - could have Candida esophgitis or other infection  May use a 730 slot if needed  Or a partner  ? This week

## 2021-02-05 NOTE — Telephone Encounter (Signed)
Patient has been on steroids for the last 2 months prescribed by her rheumatologist for arthritis.  She has been tx for thrush. She is having dysphagia . Dr. Lenna Gilford asked her to reach out about the possibility of increasing her omeprazole.  Currently taking 20 mg daily.  She has most difficulty with pills.

## 2021-02-06 NOTE — Telephone Encounter (Signed)
Patient notified of the recommendations.  She will come on 02/14/21.  No -pre-visits available.  We reviewed the instructions over the phone of NPO after midnight.  Hold her metformin and Jenuvia the am of her procedure and arrive at 7:00 with her driver.  Instructions mailed to her home.

## 2021-02-13 ENCOUNTER — Telehealth: Payer: Self-pay | Admitting: Internal Medicine

## 2021-02-13 NOTE — Telephone Encounter (Signed)
Pls call pt, she has some questions about specific medications that she take.

## 2021-02-14 ENCOUNTER — Ambulatory Visit (AMBULATORY_SURGERY_CENTER): Payer: Medicare Other | Admitting: Internal Medicine

## 2021-02-14 ENCOUNTER — Encounter: Payer: Self-pay | Admitting: Internal Medicine

## 2021-02-14 ENCOUNTER — Other Ambulatory Visit: Payer: Self-pay

## 2021-02-14 VITALS — BP 125/81 | HR 76 | Temp 97.3°F | Resp 14 | Ht 65.0 in | Wt 174.0 lb

## 2021-02-14 DIAGNOSIS — K222 Esophageal obstruction: Secondary | ICD-10-CM | POA: Diagnosis not present

## 2021-02-14 DIAGNOSIS — K449 Diaphragmatic hernia without obstruction or gangrene: Secondary | ICD-10-CM | POA: Diagnosis not present

## 2021-02-14 DIAGNOSIS — R1310 Dysphagia, unspecified: Secondary | ICD-10-CM | POA: Diagnosis not present

## 2021-02-14 MED ORDER — SODIUM CHLORIDE 0.9 % IV SOLN: 500.0000 mL | Freq: Once | INTRAVENOUS | Status: DC

## 2021-02-14 NOTE — Patient Instructions (Addendum)
I did not see any thrush in the esophagus.  There was an area of scar tissue called a ring and a hiatal hernia. I opened the ring to see if that helps - not sure it was tight enough to be a problem.  I also saw food in the stomach - can be a side effect of high blood sugar.  I also found out that Januvia can cause severe joint pains - not sure if that is what is going on with you but worth a discussion with Dr. Sandi Mariscal and Lenna Gilford if joints are hurting.  Please let me know if what I did today helps - give it a  few weeks or so and let me know if not better.  I appreciate the opportunity to care for you. Gatha Mayer, MD, [facility]   Handouts given for stricture and Hiatal hernia.  YOU HAD AN ENDOSCOPIC PROCEDURE TODAY AT [facility]:   Refer to the procedure report that was given to you for any specific questions about what was found during the examination.  If the procedure report does not answer your questions, please call your gastroenterologist to clarify.  If you requested that your care partner not be given the details of your procedure findings, then the procedure report has been included in a sealed envelope for you to review at your convenience later.  YOU SHOULD EXPECT: Some feelings of bloating in the abdomen. Passage of more gas than usual.  Walking can help get rid of the air that was put into your GI tract during the procedure and reduce the bloating. If you had a lower endoscopy (such as a colonoscopy or flexible sigmoidoscopy) you may notice spotting of blood in your stool or on the toilet paper. If you underwent a bowel prep for your procedure, you may not have a normal bowel movement for a few days.  Please Note:  You might notice some irritation and congestion in your nose or some drainage.  This is from the oxygen used during your procedure.  There is no need for concern and it should clear up in a day or so.  SYMPTOMS TO REPORT IMMEDIATELY:  Following upper  endoscopy (EGD)  Vomiting of blood or coffee ground material  New chest pain or pain under the shoulder blades  Painful or persistently difficult swallowing  New shortness of breath  Fever of 100F or higher  Black, tarry-looking stools  For urgent or emergent issues, a gastroenterologist can be reached at any hour by calling [phone]. Do not use MyChart messaging for urgent concerns.    DIET:  We do recommend a small meal at first, but then you may proceed to your regular diet.  Drink plenty of fluids but you should avoid alcoholic beverages for 24 hours.  ACTIVITY:  You should plan to take it easy for the rest of today and you should NOT DRIVE or use heavy machinery until tomorrow (because of the sedation medicines used during the test).    FOLLOW UP: Our staff will call the number listed on your records 48-72 hours following your procedure to check on you and address any questions or concerns that you may have regarding the information given to you following your procedure. If we do not reach you, we will leave a message.  We will attempt to reach you two times.  During this call, we will ask if you have developed any symptoms of COVID 19. If you develop any symptoms (ie:  fever, flu-like symptoms, shortness of breath, cough etc.) before then, please call [phone].  If you test positive for Covid 19 in the 2 weeks post procedure, please call and report this information to [place].    If any biopsies were taken you will be contacted by phone or by letter within the next 1-3 weeks.  Please call us at [phone] if you have not heard about the biopsies in 3 weeks.    SIGNATURES/CONFIDENTIALITY: You and/or your care partner have signed paperwork which will be entered into your electronic medical record.  These signatures attest to the fact that that the information above on your After Visit Summary has been reviewed and is understood.  Full responsibility of the confidentiality of this  discharge information lies with you and/or your care-partner.

## 2021-02-14 NOTE — Progress Notes (Signed)
A/ox3, pleased with MAC, report to RN 

## 2021-02-14 NOTE — Progress Notes (Signed)
Called to room to assist during endoscopic procedure.  Patient ID and intended procedure confirmed with present staff. Received instructions for my participation in the procedure from the performing physician.  

## 2021-02-14 NOTE — Op Note (Signed)
[facility] Patient Name: Deborah Conner Procedure Date: 02/14/2021 7:37 AM MRN: [id number] Endoscopist: Gatha Mayer , MD Age: 70 Referring MD:  Date of Birth: 11/24/1950 Gender: Female Account #: [id number] Procedure:                Upper GI endoscopy Indications:              Dysphagia Medicines:                Propofol per Anesthesia, Monitored Anesthesia Care Procedure:                Pre-Anesthesia Assessment:                           - Prior to the procedure, a History and Physical                            was performed, and patient medications and                            allergies were reviewed. The patient's tolerance of                            previous anesthesia was also reviewed. The risks                            and benefits of the procedure and the sedation                            options and risks were discussed with the patient.                            All questions were answered, and informed consent                            was obtained. Prior Anticoagulants: The patient has                            taken no previous anticoagulant or antiplatelet                            agents. ASA Grade Assessment: II - A patient with                            mild systemic disease. After reviewing the risks                            and benefits, the patient was deemed in                            satisfactory condition to undergo the procedure.                           After obtaining informed consent, the endoscope was  passed under direct vision. Throughout the                            procedure, the patient's blood pressure, pulse, and                            oxygen saturations were monitored continuously. The                            GIF [id number] #[id number] was introduced through the                            mouth, and advanced to the second part of duodenum.                            The upper GI  endoscopy was accomplished without                            difficulty. The patient tolerated the procedure                            well. Scope In: Scope Out: Findings:                 The examined esophagus was mildly tortuous.                           A mild Schatzki ring was found at the                            gastroesophageal junction. A TTS dilator was passed                            through the scope. Dilation with an 18-19-20 mm                            balloon dilator was performed to 20 mm at the GE                            junction and retrograde entire esophagus. The                            dilation site was examined and showed no change.                            Estimated blood loss: none. Biopsies were taken                            with a cold forceps for disruption. Estimated blood                            loss was minimal.                           A  5 cm hiatal hernia was present.                           A small amount of food (residue) was found on the                            greater curvature of the gastric body.                           Food (residue) was found in the entire duodenum.                           The gastroesophageal flap valve was visualized                            endoscopically and classified as Hill Grade IV (no                            fold, wide open lumen, hiatal hernia present).                           The exam was otherwise without abnormality. Complications:            No immediate complications. Estimated Blood Loss:     Estimated blood loss was minimal. Impression:               - Tortuous esophagus. 20 mm balloon pulled                            retrograde no effect seen                           - Mild Schatzki ring. Dilated. Biopsied to disrupt.                           - 5 cm hiatal hernia.                           - A small amount of food (residue) in the stomach.                           -  Retained food in the duodenum.                           - Gastroesophageal flap valve classified as Hill                            Grade IV (no fold, wide open lumen, hiatal hernia                            present).                           - The examination was otherwise normal. Recommendation:           - Patient has a  contact number available for                            emergencies. The signs and symptoms of potential                            delayed complications were discussed with the                            patient. Return to normal activities tomorrow.                            Written discharge instructions were provided to the                            patient.                           - Resume previous diet.                           - Continue present medications.                           - No Candida as thought possible due to steroids                           food in stomach ? from hyperglycemia                           I was checking side effects and found that Januvia                            can cause severe joint pain ? if that is an issue                            for her - onset anytime during course of tx                           will see if what I did helps - she is to let me                            know if not - ? some dysmotility - she does note sx                            correlation with steroid dose Gatha Mayer, MD 02/14/2021 8:07:29 AM This report has been signed electronically.

## 2021-02-15 ENCOUNTER — Encounter: Payer: Medicare Other | Attending: Family Medicine | Admitting: Registered"

## 2021-02-15 ENCOUNTER — Encounter: Payer: Self-pay | Admitting: Registered"

## 2021-02-15 DIAGNOSIS — E119 Type 2 diabetes mellitus without complications: Secondary | ICD-10-CM | POA: Insufficient documentation

## 2021-02-15 NOTE — Progress Notes (Signed)
Diabetes Self-Management Education  Visit Type: First/Initial  Appt. Start Time: 0800 Appt. End Time: 0915  02/15/2021  [name], identified by name and date of birth, is a 70 y.o. female with a diagnosis of Diabetes: Type 2.   ASSESSMENT  There were no vitals taken for this visit. There is no height or weight on file to calculate BMI.  A1c 7.5% Medication: Januvia, metformin (reports GI side effects)  Prednisone: Pt states she was taking 60 mg but has been reduced to 20 mg. Pt states at higher dose she notices more joint pain in knees and hips.  Medical history includes IBS: pt reports lactose intolerant and artificial sweeteners can sometimes cause diarrhea. SIBO? States she had breath test that came back indicating very high bacterial over growth. Currently being tested for autoimmune issue giant cellarteritis.  Pt states she thinks she needs to eat smaller meals and states she eats more to feel full. Pt states she thinks this mindset came from childhood when her parents gave her medicine to increase her appetite because she didn't want to eat much food, and she drank mostly milk.   Patient states breakfast is the meal she struggles with the most because it doesn't "hold" her.   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  On steroids, used to make her hungry, but now on lower dose doesn't increase appetite and back to her normal eating patterns.  Patient states she needs help checking blood sugar. Patient brought in her meter. Patient sucessfully checked her blood sugar. We got 2 different readings. One in 200s, the next in 300s. It may have been    Diabetes Self-Management Education - 02/15/21 0808       Visit Information   Visit Type First/Initial      Initial Visit   Diabetes Type Type 2    Are you currently following a meal plan? Yes    Are  you taking your medications as prescribed? Yes    Date Diagnosed about 2-3 years ago; GDM [place]   How would you rate your overall health? Good      Psychosocial Assessment   Patient Belief/Attitude about Diabetes Other (comment)   all of the above   How often do you need to have someone help you when you read instructions, pamphlets, or other written materials from your doctor or pharmacy? 1 - Never    What is the last grade level you completed in school? graduate school      Complications   Last HgB A1C per patient/outside source 7.5 %    How often do you check your blood sugar? --   1-2 x/week   Have you had a dilated eye exam in the past 12 months? Yes    Have you had a dental exam in the past 12 months? Yes    Are you checking your feet? Yes    How many days per week are you checking your feet? 5      Dietary Intake   Breakfast oatmeal, fruit or small smoothie with greens, small amount of fruit,    Snack (morning) none    Lunch salad, may use blueberries, low sodium [place] breast, crackers or whole wheat bread    Dinner 3-4 vegetables, starch (couscous, brown rice or quinio), fish or chicken  Snack (evening) sometimes crackers and peanut butter    Beverage(s) 4-8 c water, apple juice no sugar added, a little cranberry juice, if stomach upset small amount ginger ale.      Exercise   Exercise Type Light (walking / raking leaves)    How many days per week to you exercise? 2    How many minutes per day do you exercise? 15    Total minutes per week of exercise 30      Patient Education   Previous Diabetes Education No    Disease state  Definition of diabetes, type 1 and 2, and the diagnosis of diabetes    Nutrition management  Role of diet in the treatment of diabetes and the relationship between the three main macronutrients and blood glucose level;Food label reading, portion sizes and measuring food.    Physical activity and exercise  Role of exercise on  diabetes management, blood pressure control and cardiac health.    Medications Reviewed patients medication for diabetes, action, purpose, timing of dose and side effects.    Monitoring Taught/evaluated SMBG meter.;Identified appropriate SMBG and/or A1C goals.    Chronic complications Relationship between chronic complications and blood glucose control    Psychosocial adjustment Role of stress on diabetes      Individualized Goals (developed by patient)   Nutrition General guidelines for healthy choices and portions discussed    Medications take my medication as prescribed    Monitoring  test my blood glucose as discussed      Outcomes   Expected Outcomes Demonstrated interest in learning. Expect positive outcomes    Future DMSE 4-6 wks    Program Status Not Completed             Individualized Plan for Diabetes Self-Management Training:   Learning Objective:  Patient will have a greater understanding of diabetes self-management. Patient education plan is to attend individual and/or group sessions per assessed needs and concerns.   Patient Instructions  Consider checking blood sugar daily, either fasting or after a meal Bring log book back to next appointment. Call you insurance company to see if they cover a continuous glucose monitor. If so find out the criteria and contact your MD office and request Rx. Return for follow-up with blood sugar log and 2-3 days of food diary.  Expected Outcomes:  Demonstrated interest in learning. Expect positive outcomes  Education material provided: ADA - How to Thrive: A Guide for Your Journey with Diabetes and A1C conversion sheet, log book, medications,   If problems or questions, patient to contact team via:  Phone  Future DSME appointment: 4-6 wks

## 2021-02-15 NOTE — Patient Instructions (Signed)
Consider checking blood sugar daily, either fasting or after a meal Bring log book back to next appointment. Call you insurance company to see if they cover a continuous glucose monitor. If so find out the criteria and contact your MD office and request Rx. Return for follow-up with blood sugar log and 2-3 days of food diary.

## 2021-02-18 ENCOUNTER — Telehealth: Payer: Self-pay

## 2021-02-18 NOTE — Telephone Encounter (Signed)
LVM

## 2021-02-19 ENCOUNTER — Telehealth: Payer: Self-pay | Admitting: Registered"

## 2021-02-19 NOTE — Telephone Encounter (Signed)
Following up with patient to see how she is doing with checking blood sugar. LVM asking her to call office if she has any questions or would like a call back.

## 2021-02-21 ENCOUNTER — Other Ambulatory Visit: Payer: Self-pay | Admitting: Family Medicine

## 2021-02-21 DIAGNOSIS — R221 Localized swelling, mass and lump, neck: Secondary | ICD-10-CM

## 2021-02-23 ENCOUNTER — Ambulatory Visit (HOSPITAL_COMMUNITY): Admit: 2021-02-23 | Discharge status: Against Medical Advice | Payer: Medicare Other

## 2021-02-24 ENCOUNTER — Encounter (HOSPITAL_COMMUNITY): Payer: Self-pay

## 2021-02-24 ENCOUNTER — Emergency Department (HOSPITAL_COMMUNITY)
Admission: EM | Admit: 2021-02-24 | Discharge: 2021-02-24 | Disposition: A | Discharge status: Home / Self Care | Payer: Medicare Other | Attending: Emergency Medicine | Admitting: Emergency Medicine

## 2021-02-24 ENCOUNTER — Other Ambulatory Visit: Payer: Self-pay

## 2021-02-24 DIAGNOSIS — R7309 Other abnormal glucose: Secondary | ICD-10-CM | POA: Insufficient documentation

## 2021-02-24 DIAGNOSIS — Z7984 Long term (current) use of oral hypoglycemic drugs: Secondary | ICD-10-CM | POA: Diagnosis not present

## 2021-02-24 DIAGNOSIS — R739 Hyperglycemia, unspecified: Secondary | ICD-10-CM

## 2021-02-24 LAB — CBG MONITORING, ED: Glucose-Capillary: 321 mg/dL — ABNORMAL HIGH (ref 70–99)

## 2021-02-24 NOTE — Discharge Instructions (Addendum)
Please call [phone] to schedule an appointment to be seen.  Only take the morning dose of your metformin as we discussed.  Stop taking the evening dose.

## 2021-02-24 NOTE — ED Provider Notes (Signed)
[facility] DEPT Provider Note   CSN: [id number] Arrival date & time: 02/24/21  0847     History Chief Complaint  Patient presents with   Blood Sugar Problem    Deborah Conner is a 70 y.o. female.  70 year old female presents with fluctuating blood sugars times several weeks.  Patient has history of type 2 diabetes and has been on prednisone for several months due to concern for possible temporal arteritis.  States her blood sugars in the morning have been low and then go up in the evening.  States has been consistent with her diet.  She currently takes metformin 5 mg twice daily.  She has prescribed insulin pen which she has not filled yet.  No other complaints at this time      Past Medical History:  Diagnosis Date   Anal fissure    Anxiety and depression 1998/1973   some panic   Aortic sclerosis 2005   Diverticulosis 2003   GERD (gastroesophageal reflux disease) 2001   Gestational diabetes    Glucose intolerance (impaired glucose tolerance) 2000   Heart murmur    HLD (hyperlipidemia)    IBS (irritable bowel syndrome) 1998   Insomnia 2003   Internal hemorrhoids    Lactose intolerance 1998   Migraine headache childhood   Osteoporosis 2010   Polyarthralgia    Rosacea 1975   Seborrheic dermatitis age 31   Small intestinal bacterial overgrowth 08/25/2018   Tinnitus 1999   Uterine prolapse, 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    Patient Active Problem List   Diagnosis Date Noted   Newly diagnosed diabetes ([place]) 02/15/2021   Anxiety about health in pandemic 06/09/2019   Small intestinal bacterial overgrowth 08/25/2018   Uterine prolapse, 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   Menopausal symptoms 12/09/2011   Pelvic pressure in female 12/09/2011   Overweight(278.02) 12/09/2011   Osteoporosis 12/09/2011   Other dysphagia 02/24/2011   Unspecified hemorrhoids with other complication 47/42/5956   IBS (irritable bowel syndrome) 02/24/2011   Lactose intolerance  02/24/2011    Past Surgical History:  Procedure Laterality Date   COLONOSCOPY  2003, 04/11/2011   20012: diminutive polyp, diverticulosis, internal hemorrhoids   UPPER GASTROINTESTINAL ENDOSCOPY  01/30/2006, 04/11/2011   2007 and 2012:Normal      OB History     Gravida  2   Para  2   Term      Preterm      AB      Living  2      SAB      IAB      Ectopic      Multiple      Live Births              Family History  Problem Relation Age of Onset   Hypertension Mother        half-sister   Heart disease Mother        CHF   Gout Mother    Osteoporosis Mother    Hearing loss Father    Diabetes Sister    Breast cancer Sister    Arthritis Sister    Raynaud syndrome Daughter    Colon cancer Neg Hx     Social History   Tobacco Use   Smoking status: Never   Smokeless tobacco: Never  Vaping Use   Vaping Use: Never used  Substance Use Topics   Alcohol use: No    Comment: rarely   Drug use: No    Home Medications Prior  to Admission medications   Medication Sig Start Date End Date Taking? Authorizing Provider  acyclovir ointment (ZOVIRAX) 5 % as needed 09/17/16   [provider]  ALPRAZolam (XANAX) 0.25 MG tablet Take 0.125 mg by mouth as needed.     [provider]  betamethasone valerate ointment (VALISONE) 0.1 % betamethasone valerate 0.1 % topical ointment 11/05/17   [provider]  clobetasol ointment (TEMOVATE) 0.05 % APPLY TO SCALP EVERY OTHER DAY AS NEEDED FOR ITCHIN NOT TO FACE 09/02/16   [provider]  desonide (DESOWEN) 0.05 % cream apply to FACE twice a day for 2 weeks then daily if needed for itching 09/02/16   [provider]  FLUOCINOLONE ACETONIDE SCALP 0.01 % OIL  03/07/16   [provider]  fluocinonide (LIDEX) 0.05 % external solution fluocinonide 0.05 % topical solution    [provider]  furosemide (LASIX) 20 MG tablet Take 20 mg by mouth every other day.  09/29/16    [provider]  JANUVIA 100 MG tablet Take 100 mg by mouth every morning. 01/17/21   [provider]  losartan (COZAAR) 50 MG tablet Take 50 mg by mouth every morning. 01/22/21   [provider]  metFORMIN (GLUCOPHAGE-XR) 500 MG 24 hr tablet metformin ER 500 mg tablet,extended release 24 hr  TK 1 T PO BID PC    [provider]  Polyethyl Glycol-Propyl Glycol (SYSTANE OP) Place into both eyes as needed.      [provider]  pravastatin (PRAVACHOL) 10 MG tablet pravastatin 10 mg tablet  TK 1 T PO Q NIGHT PC    [provider]  predniSONE (DELTASONE) 20 MG tablet  11/15/20   [provider]  [place]    [provider]  UNABLE TO FIND fluocinolone 0.01 % scalp oil and shower cap    [provider]  valACYclovir (VALTREX) 500 MG tablet as needed.  10/03/16   [provider]  VITAMIN D PO Take 500 mg by mouth daily.    [provider]    Allergies    Other, Sulfa antibiotics, Sulfasalazine, Lactose, Lactose intolerance (gi), and Chlorthalidone  Review of Systems   Review of Systems  All other systems reviewed and are negative.  Physical Exam Updated Vital Signs BP (!) 164/91   Pulse (!) 101   Temp 97.9 F (36.6 C) (Oral)   Resp 16   SpO2 100%   Physical Exam Vitals and nursing note reviewed.  Constitutional:      General: She is not in acute distress.    Appearance: Normal appearance. She is well-developed. She is not toxic-appearing.  HENT:     Head: Normocephalic and atraumatic.  Eyes:     General: Lids are normal.     Conjunctiva/sclera: Conjunctivae normal.     Pupils: Pupils are equal, round, and reactive to light.  Neck:     Thyroid: No thyroid mass.     Trachea: No tracheal deviation.  Cardiovascular:     Rate and Rhythm: Normal rate and regular rhythm.     Heart sounds: Normal heart sounds. No murmur heard.   No gallop.   Pulmonary:     Effort: Pulmonary effort is normal. No respiratory distress.     Breath sounds: Normal breath sounds. No stridor. No decreased breath sounds, wheezing, rhonchi or rales.  Abdominal:     General: There is no distension.     Palpations: Abdomen is soft.  Tenderness: There is no abdominal tenderness. There is no rebound.  Musculoskeletal:        General: No tenderness. Normal range of motion.     Cervical back: Normal range of motion and neck supple.  Skin:    General: Skin is warm and dry.     Findings: No abrasion or rash.  Neurological:     Mental Status: She is alert and oriented to person, place, and time. Mental status is at baseline.     GCS: GCS eye subscore is 4. GCS verbal subscore is 5. GCS motor subscore is 6.     Cranial Nerves: Cranial nerves are intact. No cranial nerve deficit.     Sensory: No sensory deficit.     Motor: Motor function is intact.  Psychiatric:        Attention and Perception: Attention normal.        Speech: Speech normal.        Behavior: Behavior normal.    ED Results / Procedures / Treatments   Labs (all labs ordered are listed, but only abnormal results are displayed) Labs Reviewed  CBG MONITORING, ED - Abnormal; Notable for the following components:      Result Value   Glucose-Capillary 321 (*)    All other components within normal limits    EKG None  Radiology No results found.  Procedures Procedures   Medications Ordered in ED Medications - No data to display  ED Course  I have reviewed the triage vital signs and the nursing notes.  Pertinent labs & imaging results that were available during my care of the patient were reviewed by me and considered in my medical decision making (see chart for details).    MDM Rules/Calculators/A&P                          Patient's blood sugar 321.  Will give referral to new primary care physician per patient's request Final Clinical Impression(s) / ED Diagnoses Final  diagnoses:  None    Rx / DC Orders ED Discharge Orders     None        Lacretia Leigh, MD 02/24/21 [phone]

## 2021-02-24 NOTE — ED Triage Notes (Addendum)
Pt states she has taken Metformin for two years began taking Steroid in April and now states she has varying CBG from 120 fasting to 300 after meals. States she did not take Metformin today.

## 2021-02-27 ENCOUNTER — Telehealth: Payer: Self-pay

## 2021-02-27 NOTE — Telephone Encounter (Signed)
PCP Dr Sandi Mariscal sent over her July 13th office visit along with a question about could SIBO cause gastroparesis? Per Dr Carlean Purl no. He said she has increased glucose levels and it may by decreased gastric emptying. I have faxed this information back to Dr [facility] office. Notes scanned into epic.

## 2021-02-28 ENCOUNTER — Ambulatory Visit
Admission: RE | Admit: 2021-02-28 | Discharge: 2021-02-28 | Disposition: A | Discharge status: Home / Self Care | Payer: Medicare Other | Source: Ambulatory Visit | Attending: Family Medicine | Admitting: Family Medicine

## 2021-02-28 DIAGNOSIS — R221 Localized swelling, mass and lump, neck: Secondary | ICD-10-CM

## 2021-03-07 ENCOUNTER — Encounter: Payer: Medicare Other | Admitting: Dietician

## 2021-03-07 ENCOUNTER — Encounter: Payer: Self-pay | Admitting: Dietician

## 2021-03-07 ENCOUNTER — Other Ambulatory Visit: Payer: Self-pay

## 2021-03-07 DIAGNOSIS — E119 Type 2 diabetes mellitus without complications: Secondary | ICD-10-CM

## 2021-03-07 NOTE — Patient Instructions (Addendum)
Glucose Goals:  80-130 fasting (first thing in the am before breakfast)  100-180 2 hours after starting a meal   Generally a blood glucose should rise about 40-60 points with a meal.  If you have low blood sugar symptoms:  Treat any readings less than 70 with half cup juice or regular soda.  Obtain a prescription for needles for your Lantus pen. Take 10 units each am per MD recommendations.

## 2021-03-07 NOTE — Progress Notes (Signed)
Diabetes Self-Management Education  Visit Type: Follow-up  Appt. Start Time: 0945 Appt. End Time: 1110  03/07/2021  [name], identified by name and date of birth, is a 70 y.o. female with a diagnosis of Diabetes:  .   ASSESSMENT Patient is here today alone. She would like to learn how to inject insulin. She brought her Dexcom with her and would like to learn how to use this.  This was not covered by insurance and she paid out of pocket as she does not like to prick her fingers and desired a closer watch of her blood glucose. She is currently on prednisone for temporal arteritis.  It is the plan that this will be slowly weaned. She was last seen by a dietitian 02/15/2021 for nutrition/diabetes education. She states that her gastroenterologist advised her not to eat bread.  She reports an endoscopy recently.  History includes type 2 diabetes, temporal arteritis, SIBO, gastroparesis, dysphagia, HLD, depression, swollen neck (to be evaluated). Weight 162 lbs today with 8 lb weight loss in the past 2 months A1C >14%. Medications include:  Januvia, Metformin (XR), 10 units Lantus q am, vitamin D, calcium/vitamin D, prednisone, lasix  Glucose tested in office 210. Patient brought Lantus pen.  Instructed her on priming prior to first use, injection, site rotation, type of insulin, length of action and dose.  Patient was able to demonstrate and injected 10 units at 10:10 am today.  Instructed her to take consistently in the am and that this can be taken with her other diabetes medications.  She will move the time up to 8:30-9 which is convenient for her.  Dexcom G6 Personal CGM Training Jaleesha Buckel was educated on the following:  -Getting to know device    (Receiver programmed ) -Setting up device (high alert  250  , low alert 80  ) -Inserting sensor (left abdomen  WNL) -difference in interstitial fluid vs blood reading  -Ending sensor session -Trouble  shooting  Patient has [place] support and my contact information.  Patient lives alone.  Her husband passed in December.  She is a retired Loss adjuster, chartered.  She is active in church. Height '5\' 5"'$  (1.651 m), weight 162 lb (73.5 kg). Body mass index is 26.96 kg/m.   Diabetes Self-Management Education - 03/07/21 1819       Visit Information   Visit Type Follow-up      Initial Visit   Are you taking your medications as prescribed? Yes      Complications   How often do you check your blood sugar? 1-2 times/day    Fasting Blood glucose range (mg/dL) 130-179    Postprandial Blood glucose range (mg/dL) >200      Patient Education   Medications Taught/reviewed insulin injection, site rotation, insulin storage and needle disposal.;Reviewed patients medication for diabetes, action, purpose, timing of dose and side effects.    Monitoring Identified appropriate SMBG and/or A1C goals.;Taught/discussed recording of test results and interpretation of SMBG.    Acute complications Taught treatment of hypoglycemia - the 15 rule.;Discussed and identified patients' treatment of hyperglycemia.      Individualized Goals (developed by patient)   Nutrition General guidelines for healthy choices and portions discussed    Physical Activity Exercise 3-5 times per week;30 minutes per day    Monitoring  test my blood glucose as discussed    Reducing Risk examine blood glucose patterns    Health Coping discuss diabetes with (comment)  Patient Self-Evaluation of Goals - Patient rates self as meeting previously set goals (% of time)   Nutrition >75%    Physical Activity 50 - 75 %    Medications >75%    Monitoring >75%    Problem Solving >75%    Reducing Risk >75%    Health Coping >75%      Post-Education Assessment   Patient understands the diabetes disease and treatment process. Demonstrates understanding / competency    Patient understands incorporating nutritional management  into lifestyle. Needs Review    Patient undertands incorporating physical activity into lifestyle. Demonstrates understanding / competency    Patient understands using medications safely. Demonstrates understanding / competency    Patient understands monitoring blood glucose, interpreting and using results Demonstrates understanding / competency    Patient understands prevention, detection, and treatment of acute complications. Demonstrates understanding / competency    Patient understands prevention, detection, and treatment of chronic complications. Demonstrates understanding / competency    Patient understands how to develop strategies to address psychosocial issues. Demonstrates understanding / competency    Patient understands how to develop strategies to promote health/change behavior. Needs Review      Outcomes   Expected Outcomes Demonstrated interest in learning. Expect positive outcomes    Future DMSE 2 wks    Program Status Not Completed      Subsequent Visit   Since your last visit have you continued or begun to take your medications as prescribed? Yes    Since your last visit have you experienced any weight changes? No change             Individualized Plan for Diabetes Self-Management Training:   Learning Objective:  Patient will have a greater understanding of diabetes self-management. Patient education plan is to attend individual and/or group sessions per assessed needs and concerns.   Plan:   Patient Instructions  Glucose Goals:  80-130 fasting (first thing in the am before breakfast)  100-180 2 hours after starting a meal   Generally a blood glucose should rise about 40-60 points with a meal.  If you have low blood sugar symptoms:  Treat any readings less than 70 with half cup juice or regular soda.  Obtain a prescription for needles for your Lantus pen. Take 10 units each am per MD recommendations.  Expected Outcomes:  Demonstrated interest in learning.  Expect positive outcomes  Education material provided: insulin injection, hypoglycemia  If problems or questions, patient to contact team via:  Phone  Future DSME appointment: 2 wks

## 2021-03-11 ENCOUNTER — Ambulatory Visit
Admission: RE | Admit: 2021-03-11 | Discharge: 2021-03-11 | Disposition: A | Discharge status: Home / Self Care | Payer: Medicare Other | Source: Ambulatory Visit | Attending: Family Medicine | Admitting: Family Medicine

## 2021-03-11 ENCOUNTER — Other Ambulatory Visit: Payer: Self-pay

## 2021-03-11 DIAGNOSIS — Z1231 Encounter for screening mammogram for malignant neoplasm of breast: Secondary | ICD-10-CM

## 2021-03-16 ENCOUNTER — Telehealth: Payer: Self-pay | Admitting: Gastroenterology

## 2021-03-16 NOTE — Telephone Encounter (Signed)
Pt called this morning with hard stools for a couple days, some mucous with BM and a scant amount of BRB with BM this morning. She is taking Prednisone for giant cell arteritis on a tapering dose and she was concerned that this med was the cause. Colonoscopy in 2017 showed severe diverticulosis otherwise negative. EGD in 02/2021 for dysphagia with dilation and biopsy and she was concerned this might be the cause.   Advised to increase daily intake of fiber and water to manage mild constipation. Advised Prednisone if unlikely to be causing her current symptoms.  She was reassured that her EGD 1 month ago was not causing her current symptoms. Call us this weekend if symptoms worsen. Call the office for appt with Dr. Carlean Purl on Monday if mild symptoms persist.

## 2021-03-19 ENCOUNTER — Telehealth: Payer: Self-pay | Admitting: Internal Medicine

## 2021-03-19 NOTE — Telephone Encounter (Signed)
Inbound call from patient about making decision between two biologic. Best contact number [phone]

## 2021-03-19 NOTE — Telephone Encounter (Signed)
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  She has been on prednisone since April of this year.  They are looking at starting her on possible biologic Ctemra or methotrexate. They are discontinuing the prednisone.  Her research is that Ctemra is cautioned in diverticulosis.  From a GI standpoint do you have a recommendation based on her GI hx?

## 2021-03-20 NOTE — Telephone Encounter (Signed)
Actrema does list an increased risk of GI perforation with diverticulitis.  It is very hard to sort out 1 versus the other.  I would not be opposed to her using this medication, the best thing she should do is to discuss with her rheumatologist to whomever is prescribing the medications to discuss the pros and cons of both.  Methotrexate has potential liver consequences so it is complicated.  I cannot really answer over the phone necessarily if she wanted to come in we could make an appointment.   The short answer is I am not opposed to her using that medication she just needs to pay attention if she gets abdominal pain or signs of diverticulitis and get prompt attention.

## 2021-03-20 NOTE — Telephone Encounter (Signed)
Called patient and gave Dr. Celesta Aver recommendations.

## 2021-03-27 DIAGNOSIS — M316 Other giant cell arteritis: Secondary | ICD-10-CM | POA: Insufficient documentation

## 2021-06-11 DIAGNOSIS — E7800 Pure hypercholesterolemia, unspecified: Secondary | ICD-10-CM | POA: Insufficient documentation

## 2021-06-11 DIAGNOSIS — D849 Immunodeficiency, unspecified: Secondary | ICD-10-CM | POA: Insufficient documentation

## 2021-07-17 ENCOUNTER — Other Ambulatory Visit: Payer: Self-pay

## 2021-07-17 ENCOUNTER — Ambulatory Visit (INDEPENDENT_AMBULATORY_CARE_PROVIDER_SITE_OTHER): Payer: Medicare Other | Admitting: Podiatry

## 2021-07-17 DIAGNOSIS — E119 Type 2 diabetes mellitus without complications: Secondary | ICD-10-CM

## 2021-07-17 DIAGNOSIS — M79674 Pain in right toe(s): Secondary | ICD-10-CM | POA: Diagnosis not present

## 2021-07-17 DIAGNOSIS — M79675 Pain in left toe(s): Secondary | ICD-10-CM | POA: Diagnosis not present

## 2021-07-17 DIAGNOSIS — L603 Nail dystrophy: Secondary | ICD-10-CM

## 2021-07-17 NOTE — Progress Notes (Signed)
  Subjective:  Patient ID: Deborah Conner, female    DOB: 1951-06-21,   MRN: [id number]  Chief Complaint  Patient presents with   Ingrown Toenail    Pt states she thinks she may have a left 3rd toe ingrown    70 y.o. female presents for concern of possible ingrown of left third digit. Patient relates tenderness to the lateral border of the digit and is concerned because she is diabetic and on steroids for giant cell arteritis. Her blood sugars have been high and relates A1c was up to 14. She wanted to make sure her feet looked ok . Denies any other pedal complaints. Denies n/v/f/c.   Past Medical History:  Diagnosis Date   Anal fissure    Anxiety and depression 1998/1973   some panic   Aortic sclerosis 2005   Diverticulosis 2003   GERD (gastroesophageal reflux disease) 2001   Gestational diabetes    Glucose intolerance (impaired glucose tolerance) 2000   Heart murmur    HLD (hyperlipidemia)    IBS (irritable bowel syndrome) 1998   Insomnia 2003   Internal hemorrhoids    Lactose intolerance 1998   Migraine headache childhood   Osteoporosis 2010   Polyarthralgia    Rosacea 1975   Seborrheic dermatitis age 34   Small intestinal bacterial overgrowth 08/25/2018   Tinnitus 1999   Uterine prolapse, 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 CFT <3 seconds. Normal hair growth on digits. No edema.  Skin. No lacerations or abrasions bilateral feet. Nails 1-5 are thickened discolored and elongated with subungual debris.  Hyperkearotic tissue noted to lateral nail border of third digit with tenderness.  Musculoskeletal: MMT 5/5 bilateral lower extremities in DF, PF, Inversion and Eversion. Deceased ROM in DF of ankle joint.  Neurological: Sensation intact to light touch.   Assessment:   1. Newly diagnosed diabetes ([place])   2. Onychodystrophy   3. Pain in toes of both feet      Plan:  Patient was evaluated and treated and all  questions answered. -Discussed and educated patient on diabetic foot care, especially with  regards to the vascular, neurological and musculoskeletal systems.  -Stressed the importance of good glycemic control and the detriment of not  controlling glucose levels in relation to the foot. -Discussed supportive shoes at all times and checking feet regularly.  -Mechanically debrided all nails 1-5 bilateral using sterile nail nipper and filed with dremel without incident  -Answered all patient questions -Patient to return  in 3 months for at risk foot care -Patient advised to call the office if any problems or questions arise in the meantime.   Lorenda Peck, DPM

## 2021-08-06 ENCOUNTER — Ambulatory Visit: Payer: Medicare Other | Attending: Internal Medicine

## 2021-08-06 ENCOUNTER — Other Ambulatory Visit (HOSPITAL_BASED_OUTPATIENT_CLINIC_OR_DEPARTMENT_OTHER): Payer: Self-pay

## 2021-08-06 DIAGNOSIS — Z23 Encounter for immunization: Secondary | ICD-10-CM

## 2021-08-06 MED ORDER — PFIZER COVID-19 VAC BIVALENT 30 MCG/0.3ML IM SUSP
INTRAMUSCULAR | 0 refills | Status: DC
  Filled 2021-08-06: qty 0.3, 1d supply, fill #0

## 2021-08-06 NOTE — Progress Notes (Signed)
° °  Covid-19 Vaccination Clinic  Name:  Deborah Conner    MRN: [id number] DOB: 12-22-1950  08/06/2021  [name] was observed post Covid-19 immunization for 15 minutes without incident. She was provided with Vaccine Information Sheet and instruction to access the V-Safe system.   [name] was instructed to call 911 with any severe reactions post vaccine: Difficulty breathing  Swelling of face and throat  A fast heartbeat  A bad rash all over body  Dizziness and weakness   Immunizations Administered     Name Date Dose VIS Date Route   Pfizer Covid-19 Vaccine Bivalent Booster 08/06/2021  9:25 AM 0.3 mL 04/10/2021 Intramuscular   Manufacturer: [place]   Lot: [id number]   [place]: [phone]

## 2021-10-09 HISTORY — PX: CATARACT EXTRACTION: SUR2

## 2021-10-15 ENCOUNTER — Other Ambulatory Visit: Payer: Self-pay

## 2021-10-15 ENCOUNTER — Encounter: Payer: Self-pay | Admitting: Podiatry

## 2021-10-15 ENCOUNTER — Ambulatory Visit (INDEPENDENT_AMBULATORY_CARE_PROVIDER_SITE_OTHER): Payer: Medicare Other | Admitting: Podiatry

## 2021-10-15 DIAGNOSIS — E119 Type 2 diabetes mellitus without complications: Secondary | ICD-10-CM

## 2021-10-15 DIAGNOSIS — M79675 Pain in left toe(s): Secondary | ICD-10-CM | POA: Diagnosis not present

## 2021-10-15 DIAGNOSIS — L603 Nail dystrophy: Secondary | ICD-10-CM

## 2021-10-15 DIAGNOSIS — M79674 Pain in right toe(s): Secondary | ICD-10-CM

## 2021-10-15 NOTE — Progress Notes (Signed)
?  Subjective:  ?Patient ID: Deborah Conner, female    DOB: [birth year],   MRN: [id number] ? ?Chief Complaint  ?Patient presents with  ? Nail Problem  ? ? ?71 y.o. female presents for routine foot care in a high risk foot. . Relates continued tenderness to left third digit.  Her blood sugars have been high and relates A1c was up to 14. . Denies any other pedal complaints. Denies n/v/f/c.  ? ?PCP: Velna Hatchet MD  ? ?Past Medical History:  ?Diagnosis Date  ? Anal fissure   ? Anxiety and depression 1998/1973  ? some panic  ? Aortic sclerosis 2005  ? Diverticulosis 2003  ? GERD (gastroesophageal reflux disease) 2001  ? Gestational diabetes   ? Glucose intolerance (impaired glucose tolerance) 2000  ? Heart murmur   ? HLD (hyperlipidemia)   ? IBS (irritable bowel syndrome) 1998  ? Insomnia 2003  ? Internal hemorrhoids   ? Lactose intolerance 1998  ? Migraine headache childhood  ? Osteoporosis 2010  ? Polyarthralgia   ? Rosacea 1975  ? Seborrheic dermatitis age 38  ? Small intestinal bacterial overgrowth 08/25/2018  ? Tinnitus 1999  ? Uterine prolapse, 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  ? ? ?Objective:  ?Physical Exam: ?Vascular: DP/PT pulses 2/4 bilateral. CFT <3 seconds. Normal hair growth on digits. No edema.  ?Skin. No lacerations or abrasions bilateral feet. Nails 1-5 are thickened discolored and elongated with subungual debris.  ?Hyperkearotic tissue noted to lateral nail border of third digit with tenderness.  ?Musculoskeletal: MMT 5/5 bilateral lower extremities in DF, PF, Inversion and Eversion. Deceased ROM in DF of ankle joint.  ?Neurological: Sensation intact to light touch.  ? ?Assessment:  ? ?1. Newly diagnosed diabetes ([place])   ?2. Onychodystrophy   ?3. Pain in toes of both feet   ? ? ? ?Plan:  ?Patient was evaluated and treated and all questions answered. ?-Discussed and educated patient on diabetic foot care, especially with  ?regards to the vascular, neurological and musculoskeletal systems.  ?-Stressed the  importance of good glycemic control and the detriment of not  ?controlling glucose levels in relation to the foot. ?-Discussed supportive shoes at all times and checking feet regularly.  ?-Mechanically debrided all nails 1-5 bilateral using sterile nail nipper and filed with dremel without incident  ?-Answered all patient questions ?-Patient to return  in 3 months for at risk foot care ?-Patient advised to call the office if any problems or questions arise in the meantime. ? ? ?Lorenda Peck, DPM  ? ? ?

## 2021-11-08 ENCOUNTER — Telehealth: Payer: Self-pay | Admitting: Internal Medicine

## 2021-11-08 ENCOUNTER — Encounter: Payer: Self-pay | Admitting: Internal Medicine

## 2021-11-08 NOTE — Telephone Encounter (Signed)
Patient called seeking advise regarding omeprazole medication. Patient states that her rheumatologist and ophthalmologist advised her to get off steroids and that she stop taking omeprazole. Patient would like to know if it's okay for her to stop taking omeprazole completely or gradually? Please advise. ? ? ?

## 2021-11-08 NOTE — Telephone Encounter (Signed)
Deborah Conner was told yesterday to stop her steroids that she had been taking for almost a year. She is taking omeprazole '20mg'$  capsules daily before breakfast and her question is can she stop them or should she taper down. They were to protect her from GI issues from the steroids. ?

## 2021-11-08 NOTE — Telephone Encounter (Signed)
She has made an appointment for 12/10/2021 at 9:50am. She will continue her omeprazole until she comes for her visit. ?

## 2021-11-08 NOTE — Telephone Encounter (Signed)
My recollection and idea from chart review indicates that she was on omeprazole for other reasons as well. ? ?I recommend she schedule a nonurgent office visit to review this before we decide. ? ? ?

## 2021-12-10 ENCOUNTER — Ambulatory Visit (INDEPENDENT_AMBULATORY_CARE_PROVIDER_SITE_OTHER): Payer: Medicare Other | Admitting: Internal Medicine

## 2021-12-10 ENCOUNTER — Encounter: Payer: Self-pay | Admitting: Internal Medicine

## 2021-12-10 VITALS — BP 110/66 | HR 64 | Ht 65.0 in | Wt 154.0 lb

## 2021-12-10 DIAGNOSIS — K580 Irritable bowel syndrome with diarrhea: Secondary | ICD-10-CM | POA: Diagnosis not present

## 2021-12-10 DIAGNOSIS — K449 Diaphragmatic hernia without obstruction or gangrene: Secondary | ICD-10-CM

## 2021-12-10 DIAGNOSIS — K222 Esophageal obstruction: Secondary | ICD-10-CM | POA: Diagnosis not present

## 2021-12-10 NOTE — Progress Notes (Signed)
? ?Deborah Conner 71 y.o. Jun 10, 1951 [id number] ? ?Assessment & Plan:  ? ?Encounter Diagnoses  ?Name Primary?  ? Irritable bowel syndrome with diarrhea Yes  ? Lower esophageal ring (Schatzki)   ? Hiatal hernia   ? ?She will stop the omeprazole and taper off.  We had a discussion about probiotic foods with respect to her IBS-D.  Please see AVS for additional details. ? ?[id number], Nicki Reaper, MD ? ?Subjective:  ? ?Chief Complaint: Medication discussion ? ?HPI ?71 year old African-American woman with a history of IBS-D, SIBO, colon polyps and Schatzki ring presents to discuss stopping omeprazole.  She has never really had problems with GERD that I know of, she had taken omeprazole when she was on steroids for autoimmune issues and steroids have been withdrawn and the doctors prescribing that told her she could stop taking her omeprazole.  She would like to be on less medication if possible.  She had an EGD for dysphagia in July 2022 with a small to medium hiatal hernia (5 cm), some retained food material most likely attributable to Januvia use, and a Schatzki ring that did not change with a 20 mm balloon dilation but was biopsied to disrupt since she was having dysphagia.  Her dysphagia has improved significantly with dietary modifications, chewing well and not bending over or hunching over when eating. ?Allergies  ?Allergen Reactions  ? Other Itching  ? Sulfa Antibiotics Itching  ? Sulfasalazine Itching  ? Lactose   ? Lactose Intolerance (Gi)   ? Chlorthalidone Rash  ? ?Current Meds  ?Medication Sig  ? acyclovir ointment (ZOVIRAX) 5 % as needed  ? ALPRAZolam (XANAX) 0.25 MG tablet Take 0.125 mg by mouth as needed.   ? Calcium-Vitamin D (CALTRATE 600 PLUS-VIT D PO) Take by mouth.  ? COVID-19 mRNA bivalent vaccine, Pfizer, (PFIZER COVID-19 VAC BIVALENT) injection Inject into the muscle.  ? fluocinonide (LIDEX) 0.05 % external solution fluocinonide 0.05 % topical solution  ? furosemide (LASIX) 20 MG tablet Take  20 mg by mouth every other day.   ? JANUVIA 100 MG tablet Take 100 mg by mouth every morning.  ? losartan (COZAAR) 50 MG tablet Take 50 mg by mouth every morning.  ? metFORMIN (GLUCOPHAGE-XR) 500 MG 24 hr tablet metformin ER 500 mg tablet,extended release 24 hr ? TK 1 T PO BID PC  ? omeprazole (PRILOSEC) 20 MG capsule Take 1 capsule by mouth daily before breakfast.  ? Polyethyl Glycol-Propyl Glycol (SYSTANE OP) Place into both eyes as needed.    ? pravastatin (PRAVACHOL) 10 MG tablet pravastatin 10 mg tablet ? TK 1 T PO Q NIGHT PC  ? PRESCRIPTION MEDICATION CREAMS FOR SEBORRHEIC DERMATITIS  ? tocilizumab (ACTEMRA) 400 MG/20ML SOLN injection See admin instructions. Injection weekly  ? UNABLE TO FIND fluocinolone 0.01 % scalp oil and shower cap  ? valACYclovir (VALTREX) 500 MG tablet as needed.   ? VITAMIN D PO Take 500 mg by mouth daily.  ? ?Past Medical History:  ?Diagnosis Date  ? Anal fissure   ? Anxiety and depression 1998/1973  ? some panic  ? Aortic sclerosis 2005  ? Diverticulosis 2003  ? GERD (gastroesophageal reflux disease) 2001  ? Gestational diabetes   ? Glucose intolerance (impaired glucose tolerance) 2000  ? Heart murmur   ? HLD (hyperlipidemia)   ? IBS (irritable bowel syndrome) 1998  ? Insomnia 2003  ? Internal hemorrhoids   ? Lactose intolerance 1998  ? Migraine headache childhood  ? Osteoporosis 2010  ? Polyarthralgia   ?  Rosacea 1975  ? Seborrheic dermatitis age 62  ? Small intestinal bacterial overgrowth 08/25/2018  ? Tinnitus 1999  ? Uterine prolapse, 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  ? ?Past Surgical History:  ?Procedure Laterality Date  ? CATARACT EXTRACTION Right 10/2021  ? with lens implant, had to have 2 surgeries b/c part of cataract broke off  ? COLONOSCOPY  2003, 04/11/2011  ? 20012: diminutive polyp, diverticulosis, internal hemorrhoids  ? UPPER GASTROINTESTINAL ENDOSCOPY  01/30/2006, 04/11/2011  ? 2007 and 2012:Normal   ? ?Social History  ? ?Social History Narrative  ? is married and lives with her  husband, he has a severe seizure disorder which is a source of stress  ? 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 Rare alcohol, never smoker, no tobacco now and no drugs.  ? ?family history includes Arthritis in her sister; Breast cancer in her sister; Diabetes in her sister; Gout in her mother; Hearing loss in her father; Heart disease in her mother; Hypertension in her mother; Osteoporosis in her mother; Raynaud syndrome in her daughter. ? ? ?Review of Systems ?As per HPI ? ?Objective:  ? Physical Exam ?BP 110/66   Pulse 64   Ht '5\' 5"'$  (1.651 m)   Wt 154 lb (69.9 kg)   BMI 25.63 kg/m?  ? ?23 minutes total time ?

## 2021-12-10 NOTE — Patient Instructions (Signed)
You may stop omeprazole.  As we discussed sometimes there is a rebound of acid production causing increased heartburn or indigestion for days or weeks.  In order to reduce that risk please do the following: ? ?Take the omeprazole every other day for about 2 weeks and then try to stop it.  You may find you need to take it every third day if you are experiencing some symptoms and gradually come off it that way as well.  You may use antacids like Tums or Maalox or Mylanta or over-the-counter Pepcid as needed. ? ? ?If you have persistent problems once you are off the medicine let me know and we can discuss what to do next. ? ? ?Probiotics are important for health, they are best gotten through foods.  Pills are not proven to be effective in some research is suggesting they may actually cause problems. ? ?Here are some examples of probiotic foods (also known as fermented foods): ? ?Yogurt-high protein whole fat yogurt is best I believe, [place] yogurt is an example.  Add your own fruit because sugar is almost always added to yogurt with fruit in it found at the store. ? ?Kefir-kind of like liquid yogurt ? ?Miso-an Asian fermented bean paste ? ?Sauerkraut ? ?Kimchi-Asian fermented cabbage ? ?Apple cider vinegar-use the liquid not Gummies or pills.  Some people can tolerate a spoonful of this alone but many fine adding it to water reduces the acidity.  You can work up to higher doses.  If taking it on deluded rinse your mouth or drink water afterwards to reduce risk of dental damage.  A tablespoon a day is a good dose.  You could take more also.  Some people also find this helpful with reflux. ? ? ?

## 2021-12-17 ENCOUNTER — Telehealth: Payer: Self-pay | Admitting: Internal Medicine

## 2021-12-17 NOTE — Telephone Encounter (Signed)
Patient called this morning stating she was still not on any kind of regular routine with her bowel movements and wanted to know if there was something else she could try.  Please call patient and advise.  Thank you. ?

## 2021-12-17 NOTE — Telephone Encounter (Signed)
Pt stated that the last couple of days that her BM have been inconsistent. Pt stated that she recently had an office visit with Dr. Carlean Purl. Chart was reviewed. Pt states that she is now taking the Omeprazole every other day: ?Pt stated that she recently started using the Eleva and is going to start taking [place] Yogurt today  as well for probiotics: ?Pt was made aware that sauerkraut is a great probiotic and although it can cause frequent BM. Pt was encouraged to continue with the Probiotics and and give it several weeks for her body to get acclimated to the healthy probiotics. Pt was notified to call our office back in 2 weeks if she is still having problems. ?Pt verbalized understanding with all questions answered.  ? ?

## 2022-01-15 ENCOUNTER — Encounter: Payer: Self-pay | Admitting: Podiatry

## 2022-01-15 ENCOUNTER — Ambulatory Visit (INDEPENDENT_AMBULATORY_CARE_PROVIDER_SITE_OTHER): Payer: Medicare Other | Admitting: Podiatry

## 2022-01-15 DIAGNOSIS — M79674 Pain in right toe(s): Secondary | ICD-10-CM | POA: Diagnosis not present

## 2022-01-15 DIAGNOSIS — M79675 Pain in left toe(s): Secondary | ICD-10-CM | POA: Diagnosis not present

## 2022-01-15 DIAGNOSIS — E119 Type 2 diabetes mellitus without complications: Secondary | ICD-10-CM

## 2022-01-15 DIAGNOSIS — L603 Nail dystrophy: Secondary | ICD-10-CM | POA: Diagnosis not present

## 2022-01-15 NOTE — Progress Notes (Signed)
  Subjective:  Patient ID: Deborah Conner, female    DOB: 02/24/1951,   MRN: [id number]  Chief Complaint  Patient presents with   Ingrown Toenail    Nail check,A1c-6.3 BG-113, Nail trim     71 y.o. female presents for routine foot care in a high risk foot. . Relates continued tenderness to left third digit.  Her blood sugars have been high and relates A1c was up to 14. Most recent in January was 6.3.Requesting nail trim today . Denies any other pedal complaints. Denies n/v/f/c.   PCP: Velna Hatchet MD   Past Medical History:  Diagnosis Date   Anal fissure    Anxiety and depression 1998/1973   some panic   Aortic sclerosis 2005   Diverticulosis 2003   GERD (gastroesophageal reflux disease) 2001   Gestational diabetes    Glucose intolerance (impaired glucose tolerance) 2000   Heart murmur    HLD (hyperlipidemia)    IBS (irritable bowel syndrome) 1998   Insomnia 2003   Internal hemorrhoids    Lactose intolerance 1998   Migraine headache childhood   Osteoporosis 2010   Polyarthralgia    Rosacea 1975   Seborrheic dermatitis age 45   Small intestinal bacterial overgrowth 08/25/2018   Tinnitus 1999   Uterine prolapse, 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 CFT <3 seconds. Normal hair growth on digits. No edema.  Skin. No lacerations or abrasions bilateral feet. Nails 1-5 are thickened discolored and elongated with subungual debris.  Hyperkearotic tissue noted to lateral nail border of third digit with tenderness.  Musculoskeletal: MMT 5/5 bilateral lower extremities in DF, PF, Inversion and Eversion. Deceased ROM in DF of ankle joint.  Neurological: Sensation intact to light touch.   Assessment:   1. Newly diagnosed diabetes ([place])   2. Pain in toes of both feet   3. Onychodystrophy      Plan:  Patient was evaluated and treated and all questions answered. -Discussed and educated patient on diabetic foot care,  especially with  regards to the vascular, neurological and musculoskeletal systems.  -Stressed the importance of good glycemic control and the detriment of not  controlling glucose levels in relation to the foot. -Discussed supportive shoes at all times and checking feet regularly.  -Mechanically debrided all nails 1-5 bilateral using sterile nail nipper and filed with dremel without incident  -Answered all patient questions -Patient to return  in 3 months for at risk foot care -Patient advised to call the office if any problems or questions arise in the meantime.   Lorenda Peck, DPM

## 2022-01-27 ENCOUNTER — Other Ambulatory Visit: Payer: Self-pay | Admitting: Obstetrics and Gynecology

## 2022-01-27 DIAGNOSIS — M810 Age-related osteoporosis without current pathological fracture: Secondary | ICD-10-CM

## 2022-02-12 ENCOUNTER — Other Ambulatory Visit: Payer: Self-pay | Admitting: Internal Medicine

## 2022-02-12 DIAGNOSIS — Z1231 Encounter for screening mammogram for malignant neoplasm of breast: Secondary | ICD-10-CM

## 2022-03-12 ENCOUNTER — Ambulatory Visit
Admission: RE | Admit: 2022-03-12 | Discharge: 2022-03-12 | Disposition: A | Discharge status: Home / Self Care | Payer: Medicare Other | Source: Ambulatory Visit | Attending: Internal Medicine | Admitting: Internal Medicine

## 2022-03-12 DIAGNOSIS — Z1231 Encounter for screening mammogram for malignant neoplasm of breast: Secondary | ICD-10-CM

## 2022-04-22 ENCOUNTER — Ambulatory Visit (INDEPENDENT_AMBULATORY_CARE_PROVIDER_SITE_OTHER): Payer: Medicare Other | Admitting: Podiatry

## 2022-04-22 DIAGNOSIS — M79675 Pain in left toe(s): Secondary | ICD-10-CM

## 2022-04-22 DIAGNOSIS — M79674 Pain in right toe(s): Secondary | ICD-10-CM | POA: Diagnosis not present

## 2022-04-22 DIAGNOSIS — L603 Nail dystrophy: Secondary | ICD-10-CM | POA: Diagnosis not present

## 2022-04-22 DIAGNOSIS — E119 Type 2 diabetes mellitus without complications: Secondary | ICD-10-CM | POA: Diagnosis not present

## 2022-04-22 NOTE — Progress Notes (Signed)
  Subjective:  Patient ID: Deborah Conner, female    DOB: 10-15-1950,   MRN: [id number]  Chief Complaint  Patient presents with   Foot Problem    ROUTINE FOOT CARE     71 y.o. female presents for routine foot care in a high risk foot. . Relates continued tenderness to left third digit.  Her blood sugars have been high and relates A1c was up to 14. Most recent in January was 6.3.Requesting nail trim today . Denies any other pedal complaints. Denies n/v/f/c.   PCP: Velna Hatchet MD   Past Medical History:  Diagnosis Date   Anal fissure    Anxiety and depression 1998/1973   some panic   Aortic sclerosis 2005   Diverticulosis 2003   GERD (gastroesophageal reflux disease) 2001   Gestational diabetes    Glucose intolerance (impaired glucose tolerance) 2000   Heart murmur    HLD (hyperlipidemia)    IBS (irritable bowel syndrome) 1998   Insomnia 2003   Internal hemorrhoids    Lactose intolerance 1998   Migraine headache childhood   Osteoporosis 2010   Polyarthralgia    Rosacea 1975   Seborrheic dermatitis age 35   Small intestinal bacterial overgrowth 08/25/2018   Tinnitus 1999   Uterine prolapse, 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 CFT <3 seconds. Normal hair growth on digits. No edema.  Skin. No lacerations or abrasions bilateral feet. Nails 1-5 are thickened discolored and elongated with subungual debris.  Hyperkearotic tissue noted to lateral nail border of third digit with tenderness.  Musculoskeletal: MMT 5/5 bilateral lower extremities in DF, PF, Inversion and Eversion. Deceased ROM in DF of ankle joint.  Neurological: Sensation intact to light touch.   Assessment:   1. Onychodystrophy   2. Pain in toes of both feet   3. Newly diagnosed diabetes ([place])       Plan:  Patient was evaluated and treated and all questions answered. -Discussed and educated patient on diabetic foot care, especially with   regards to the vascular, neurological and musculoskeletal systems.  -Stressed the importance of good glycemic control and the detriment of not  controlling glucose levels in relation to the foot. -Discussed supportive shoes at all times and checking feet regularly.  -Mechanically debrided all nails 1-5 bilateral using sterile nail nipper and filed with dremel without incident  -Answered all patient questions -Patient to return  in 3 months for at risk foot care -Patient advised to call the office if any problems or questions arise in the meantime.   Lorenda Peck, DPM

## 2022-05-06 ENCOUNTER — Other Ambulatory Visit (HOSPITAL_BASED_OUTPATIENT_CLINIC_OR_DEPARTMENT_OTHER): Payer: Self-pay

## 2022-05-06 ENCOUNTER — Other Ambulatory Visit: Payer: Self-pay

## 2022-05-15 ENCOUNTER — Other Ambulatory Visit: Payer: Self-pay

## 2022-05-15 MED ORDER — FLUAD QUADRIVALENT 0.5 ML IM PRSY
0.5000 mL | PREFILLED_SYRINGE | INTRAMUSCULAR | 0 refills | Status: AC
  Filled 2022-05-15: qty 0.5, 1d supply, fill #0

## 2022-05-30 ENCOUNTER — Telehealth: Payer: Self-pay | Admitting: Internal Medicine

## 2022-05-30 NOTE — Telephone Encounter (Signed)
Inbound call from patient requesting a call back . Please call to advise.

## 2022-06-02 NOTE — Telephone Encounter (Signed)
Pt stated that she started having some right upper abdominal pain along with some loose stools last week: Pt stated that she stopped drinking coffee and switched yogurt to lactose free when seems to be helping her symptoms over the weekend:  Pt notified to remain off the coffee and continue with the lactose free yogurt and give our office a call back on Friday for a symptom update: Pt verbalized understanding with all questions answered.

## 2022-06-06 NOTE — Telephone Encounter (Signed)
Pt stated that she was calling with an update. Pt stated that she had a good week, staying off the coffee, eating some yogurt and sour kraut  daily and Ginger tea: Pt stated that her stools are better this week and no abdominal pain. Pt stated that she had one episode of soft stool and the others were formed: Pt stated that she ate chicken salad when she had the episode of soft stool: Pt was notified to try an keep a diary of the foods she eats that make her stomach upset: Pt verbalized understanding with all questions answered.

## 2022-06-26 ENCOUNTER — Other Ambulatory Visit: Payer: Self-pay

## 2022-06-26 MED ORDER — COVID-19 MRNA 2023-2024 VACCINE (COMIRNATY) 0.3 ML INJECTION
0.3000 mL | Freq: Once | INTRAMUSCULAR | 0 refills | Status: AC
  Filled 2022-06-26: qty 0.3, 1d supply, fill #0

## 2022-07-24 ENCOUNTER — Ambulatory Visit (INDEPENDENT_AMBULATORY_CARE_PROVIDER_SITE_OTHER): Payer: Medicare Other | Admitting: Podiatry

## 2022-07-24 DIAGNOSIS — M79675 Pain in left toe(s): Secondary | ICD-10-CM

## 2022-07-24 DIAGNOSIS — L603 Nail dystrophy: Secondary | ICD-10-CM

## 2022-07-24 DIAGNOSIS — M79674 Pain in right toe(s): Secondary | ICD-10-CM

## 2022-07-24 DIAGNOSIS — E1142 Type 2 diabetes mellitus with diabetic polyneuropathy: Secondary | ICD-10-CM

## 2022-07-24 NOTE — Progress Notes (Signed)
  Subjective:  Patient ID: Deborah Conner, female    DOB: 07-10-1951,  MRN: [id number]  Chief Complaint  Patient presents with   foot care    DFC-nail trim    71 y.o. female presents with the above complaint. History confirmed with patient. Patient presenting with pain related to dystrophic thickened elongated nails. Patient is unable to trim own nails related to nail dystrophy and/or mobility issues. Patient does  have a history of T2DM.   Objective:  Physical Exam: warm, good capillary refill nail exam onychomycosis of the toenails, onycholysis, and dystrophic nails DP pulses palpable, PT pulses palpable, and protective sensation intact Left Foot:  Pain with palpation of nails due to elongation and dystrophic growth.  Right Foot: Pain with palpation of nails due to elongation and dystrophic growth.   Assessment:   1. Onychodystrophy   2. Pain in toes of both feet   3. DM type 2 with diabetic peripheral neuropathy ([place])      Plan:  Patient was evaluated and treated and all questions answered.   #Onychomycosis with pain  -Nails palliatively debrided as below. -Educated on self-care  Procedure: Nail Debridement Rationale: Pain Type of Debridement: manual, sharp debridement. Instrumentation: Nail nipper, rotary burr. Number of Nails: 10  Return in about 3 months (around 10/23/2022) for [facility].         Everitt Amber, DPM [facility]

## 2022-09-29 ENCOUNTER — Encounter: Payer: Self-pay | Admitting: *Deleted

## 2022-10-23 ENCOUNTER — Ambulatory Visit (INDEPENDENT_AMBULATORY_CARE_PROVIDER_SITE_OTHER): Payer: Medicare Other | Admitting: Podiatry

## 2022-10-23 VITALS — BP 130/82 | HR 78

## 2022-10-23 DIAGNOSIS — M79674 Pain in right toe(s): Secondary | ICD-10-CM

## 2022-10-23 DIAGNOSIS — E1142 Type 2 diabetes mellitus with diabetic polyneuropathy: Secondary | ICD-10-CM

## 2022-10-23 DIAGNOSIS — M79675 Pain in left toe(s): Secondary | ICD-10-CM | POA: Diagnosis not present

## 2022-10-23 DIAGNOSIS — B351 Tinea unguium: Secondary | ICD-10-CM | POA: Diagnosis not present

## 2022-10-23 DIAGNOSIS — L603 Nail dystrophy: Secondary | ICD-10-CM

## 2022-10-23 NOTE — Progress Notes (Signed)
  Subjective:  Patient ID: Deborah Conner, female    DOB: [birth year],  MRN: [id number]  Chief Complaint  Patient presents with   Diabetes    Routine foot care with diabetic foot exam needed.     72 y.o. female presents with the above complaint. History confirmed with patient. Patient presenting with pain related to dystrophic thickened elongated nails. Patient is unable to trim own nails related to nail dystrophy and/or mobility issues. Patient does  have a history of T2DM.   Objective:  Physical Exam: warm, good capillary refill nail exam onychomycosis of the toenails, onycholysis, and dystrophic nails DP pulses palpable, PT pulses palpable, and protective sensation intact Left Foot:  Pain with palpation of nails due to elongation and dystrophic growth.  Right Foot: Pain with palpation of nails due to elongation and dystrophic growth.   Assessment:   1. Pain due to onychomycosis of toenails of both feet   2. Onychodystrophy   3. DM type 2 with diabetic peripheral neuropathy ([place])       Plan:  Patient was evaluated and treated and all questions answered.   #Onychomycosis with pain  -Nails palliatively debrided as below. -Educated on self-care  Procedure: Nail Debridement Rationale: Pain Type of Debridement: manual, sharp debridement. Instrumentation: Nail nipper, rotary burr. Number of Nails: 10  Return in about 3 months (around 01/23/2023) for [facility].         Everitt Amber, DPM [facility]

## 2022-11-19 DIAGNOSIS — R6889 Other general symptoms and signs: Secondary | ICD-10-CM | POA: Insufficient documentation

## 2022-11-25 ENCOUNTER — Ambulatory Visit: Payer: Medicare Other | Admitting: Obstetrics and Gynecology

## 2023-01-29 ENCOUNTER — Ambulatory Visit (INDEPENDENT_AMBULATORY_CARE_PROVIDER_SITE_OTHER): Payer: Medicare Other | Admitting: Podiatry

## 2023-01-29 ENCOUNTER — Other Ambulatory Visit: Payer: Self-pay | Admitting: Internal Medicine

## 2023-01-29 DIAGNOSIS — B351 Tinea unguium: Secondary | ICD-10-CM | POA: Diagnosis not present

## 2023-01-29 DIAGNOSIS — M79675 Pain in left toe(s): Secondary | ICD-10-CM | POA: Diagnosis not present

## 2023-01-29 DIAGNOSIS — E1142 Type 2 diabetes mellitus with diabetic polyneuropathy: Secondary | ICD-10-CM

## 2023-01-29 DIAGNOSIS — M79674 Pain in right toe(s): Secondary | ICD-10-CM

## 2023-01-29 DIAGNOSIS — Z1231 Encounter for screening mammogram for malignant neoplasm of breast: Secondary | ICD-10-CM

## 2023-01-29 NOTE — Progress Notes (Signed)
  Subjective:  Patient ID: Deborah Conner, female    DOB: 1951/01/16,  MRN: [id number]  Chief Complaint  Patient presents with   Nail Problem    [facility]    72 y.o. female presents with the above complaint. History confirmed with patient. Patient presenting with pain related to dystrophic thickened elongated nails. Patient is unable to trim own nails related to nail dystrophy and/or mobility issues. Patient does  have a history of T2DM.   Objective:  Physical Exam: warm, good capillary refill nail exam onychomycosis of the toenails, onycholysis, and dystrophic nails DP pulses palpable, PT pulses palpable, and protective sensation intact Left Foot:  Pain with palpation of nails due to elongation and dystrophic growth.  Right Foot: Pain with palpation of nails due to elongation and dystrophic growth.   Assessment:   1. Pain due to onychomycosis of toenails of both feet   2. DM type 2 with diabetic peripheral neuropathy ([facility])        Plan:  Patient was evaluated and treated and all questions answered.   #Onychomycosis with pain  -Nails palliatively debrided as below. -Educated on self-care  Procedure: Nail Debridement Rationale: Pain Type of Debridement: manual, sharp debridement. Instrumentation: Nail nipper, rotary burr. Number of Nails: 10  Return in about 3 months (around 05/01/2023) for [facility].         Corinna Gab, DPM [facility]

## 2023-03-16 ENCOUNTER — Ambulatory Visit: Admission: RE | Admit: 2023-03-16 | Payer: Medicare Other | Source: Ambulatory Visit

## 2023-03-16 DIAGNOSIS — Z1231 Encounter for screening mammogram for malignant neoplasm of breast: Secondary | ICD-10-CM

## 2023-03-23 ENCOUNTER — Ambulatory Visit (INDEPENDENT_AMBULATORY_CARE_PROVIDER_SITE_OTHER): Payer: Medicare Other | Admitting: Obstetrics and Gynecology

## 2023-03-23 ENCOUNTER — Other Ambulatory Visit (HOSPITAL_COMMUNITY)
Admission: RE | Admit: 2023-03-23 | Discharge: 2023-03-23 | Disposition: A | Discharge status: Home / Self Care | Payer: Medicare Other | Source: Ambulatory Visit | Attending: Obstetrics and Gynecology | Admitting: Obstetrics and Gynecology

## 2023-03-23 ENCOUNTER — Encounter: Payer: Self-pay | Admitting: Obstetrics and Gynecology

## 2023-03-23 VITALS — BP 150/85 | HR 72 | Ht 64.17 in | Wt 169.0 lb

## 2023-03-23 DIAGNOSIS — R82998 Other abnormal findings in urine: Secondary | ICD-10-CM | POA: Diagnosis not present

## 2023-03-23 DIAGNOSIS — N952 Postmenopausal atrophic vaginitis: Secondary | ICD-10-CM

## 2023-03-23 DIAGNOSIS — N8110 Cystocele, unspecified: Secondary | ICD-10-CM | POA: Diagnosis not present

## 2023-03-23 DIAGNOSIS — R350 Frequency of micturition: Secondary | ICD-10-CM | POA: Diagnosis not present

## 2023-03-23 DIAGNOSIS — R159 Full incontinence of feces: Secondary | ICD-10-CM

## 2023-03-23 LAB — POCT URINALYSIS DIPSTICK
Bilirubin, UA: NEGATIVE
Blood, UA: NEGATIVE
Glucose, UA: NEGATIVE
Ketones, UA: NEGATIVE
Nitrite, UA: NEGATIVE
Protein, UA: NEGATIVE
Spec Grav, UA: 1.02 (ref 1.010–1.025)
Urobilinogen, UA: 0.2 E.U./dL
pH, UA: 7 (ref 5.0–8.0)

## 2023-03-23 NOTE — Patient Instructions (Signed)
Accidental Bowel Leakage: Our goal is to achieve formed bowel movements daily or every-other-day without leakage.  You may need to try different combinations of the following options to find what works best for you.  Some management options include: Dietary changes (more leafy greens, vegetables and fruits; less processed foods) Fiber supplementation (Metamucil or something with psyllium as active ingredient) Over-the-counter imodium (tablets or liquid) to help solidify the stool and prevent leakage of stool.   

## 2023-03-23 NOTE — Progress Notes (Signed)
Portal Urogynecology New Patient Evaluation and Consultation  Referring Provider: Alysia Penna, MD PCP: Osborn Coho, MD Date of Service: 03/23/2023  SUBJECTIVE Chief Complaint: New Patient (Initial Visit) Deborah Conner is a 72 y.o. female here for a consult for prolapse. Pt said she uses a pessary.)  History of Present Illness: Deborah Conner is a 72 y.o. Black or African-American female presenting for evaluation of prolapse.     Urinary Symptoms: Does not leak urine.   Day time voids 5-6.  Nocturia: 1 times per night to void. Voiding dysfunction: she empties her bladder well.  does not use a catheter to empty bladder.  When urinating, she feels a weak stream and difficulty starting urine stream   UTIs: 1 UTI's in the last year.   Denies history of blood in urine and kidney or bladder stones  Pelvic Organ Prolapse Symptoms:                  She Admits to a feeling of a bulge the vaginal area. It has been present for about 10 years She Admits to seeing a bulge.  This bulge is bothersome. Currently has the pessary and only feels bulge if pessary has been out for 2-3 weeks.  Has had the pessary for quite a while. Previously, she was taking it out every few days and then was told to take it out once every 3 months. But she noticed a little more abrasions and started to use estrogen cream. So now, she is removing it once a week. Has noticed a little bit of bleeding with the pessary changes, but has recently been able to change her position and that has helped. She is placing the estrogen on the pessary.   Bowel Symptom: Bowel movements: 1-3 time(s) per day Stool consistency: soft  Straining: no.  Splinting: yes.  Incomplete evacuation: yes.  She admits accidental bowel leakage / fecal incontinence- happens only when she has diarrhea due to IBS  - taking align probiotic  - tried pelvic PT with Eulis Foster Bowel regimen: none  Sexual Function Sexually  active: no.    Pelvic Pain Denies pelvic pain  Past Medical History:  Past Medical History:  Diagnosis Date   Anal fissure    Anxiety and depression 1998/1973   some panic   Aortic sclerosis 2005   Diverticulosis 2003   GERD (gastroesophageal reflux disease) 2001   Gestational diabetes    Glucose intolerance (impaired glucose tolerance) 2000   Heart murmur    HLD (hyperlipidemia)    IBS (irritable bowel syndrome) 1998   Insomnia 2003   Internal hemorrhoids    Lactose intolerance 1998   Migraine headache childhood   Osteoporosis 2010   Polyarthralgia    Rosacea 1975   Seborrheic dermatitis age 49   Small intestinal bacterial overgrowth 08/25/2018   Tinnitus 1999   Uterine prolapse, 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     Past Surgical History:   Past Surgical History:  Procedure Laterality Date   CATARACT EXTRACTION Right 10/2021   with lens implant, had to have 2 surgeries b/c part of cataract broke off   COLONOSCOPY  2003, 04/11/2011   20012: diminutive polyp, diverticulosis, internal hemorrhoids   UPPER GASTROINTESTINAL ENDOSCOPY  01/30/2006, 04/11/2011   2007 and 2012:Normal      Past OB/GYN History: OB History  Gravida Para Term Preterm AB Living  2 2 2     2   SAB IAB Ectopic Multiple Live Births  2    # Outcome Date GA Lbr Len/2nd Weight Sex Type Anes PTL Lv  2 Term      Vag-Spont     1 Term      Vag-Spont      Menopausal: Yes, Denies vaginal bleeding since menopause Last pap smear was 02/2023.  Any history of abnormal pap smears: no.(Results scanned into epic)   Medications: She has a current medication list which includes the following prescription(s): acyclovir ointment, alprazolam, betamethasone valerate ointment, calcium-vitamin d, clobetasol ointment, desonide, fluocinolone acetonide scalp, fluocinonide, furosemide, fluad quadrivalent, januvia, losartan, metformin, polyethyl glycol-propyl glycol, pravastatin, prednisolone acetate, PRESCRIPTION MEDICATION,  actemra, UNABLE TO FIND, valacyclovir, and vitamin d.   Allergies: Patient is allergic to other, sulfa antibiotics, sulfasalazine, alendronate sodium, lactose, lactose intolerance ([place]), and chlorthalidone.   Social History:  Social History   Tobacco Use   Smoking status: Never   Smokeless tobacco: Never  Vaping Use   Vaping status: Never Used  Substance Use Topics   Alcohol use: No    Comment: rarely   Drug use: No    Relationship status: widowed She lives alone.   She is not employed. Regular exercise: No History of abuse: No  Family History:   Family History  Problem Relation Age of Onset   Hypertension Mother        half-sister   Heart disease Mother        CHF   Gout Mother    Osteoporosis Mother    Hearing loss Father    Diabetes Sister    Breast cancer Sister    Arthritis Sister    Raynaud syndrome Daughter    Colon cancer Neg Hx      Review of Systems: Review of Systems  Constitutional:  Positive for malaise/fatigue. Negative for fever and weight loss.  Respiratory:  Negative for cough, shortness of breath and wheezing.   Cardiovascular:  Negative for chest pain, palpitations and leg swelling.  Gastrointestinal:  Negative for abdominal pain and blood in stool.  Genitourinary:  Negative for dysuria.       Vaginal discharge  Musculoskeletal:  Negative for myalgias.  Skin:  Negative for rash.  Neurological:  Positive for headaches. Negative for dizziness.  Endo/Heme/Allergies:  Does not bruise/bleed easily.  Psychiatric/Behavioral:  Positive for depression. The patient is nervous/anxious.      OBJECTIVE Physical Exam: Vitals:   03/23/23 1024 03/23/23 1029  BP: (!) 151/79 (!) 150/85  Pulse: 72 72  Weight: 169 lb (76.7 kg)   Height: 5' 4.17" (1.63 m)     Physical Exam Constitutional:      General: She is not in acute distress. Pulmonary:     Effort: Pulmonary effort is normal.  Abdominal:     General: There is no distension.     Palpations:  Abdomen is soft.     Tenderness: There is no abdominal tenderness. There is no rebound.  Musculoskeletal:        General: No swelling. Normal range of motion.  Skin:    General: Skin is warm and dry.     Findings: No rash.  Neurological:     Mental Status: She is alert and oriented to person, place, and time.  Psychiatric:        Mood and Affect: Mood normal.        Behavior: Behavior normal.      GU / Detailed Urogynecologic Evaluation:  Pelvic Exam: Normal external female genitalia; Bartholin's and Skene's glands normal in appearance; urethral  meatus normal in appearance, no urethral masses or discharge.   CST: negative  Pessary removed. Speculum exam reveals normal vaginal mucosa with atrophy. Cervix normal appearance. Uterus normal single, nontender. Adnexa no mass, fullness, tenderness.  Pessary replaced.    Pelvic floor strength I/V  Pelvic floor musculature: Right levator non-tender, Right obturator non-tender, Left levator non-tender, Left obturator non-tender  POP-Q:   POP-Q  -2                                            Aa   -2                                           Ba  -7.5                                              C   2                                            Gh  4                                            Pb  8.5                                            tvl   -2                                            Ap  -2                                            Bp  -8                                              D      Rectal Exam:  Normal external rectum  Post-Void Residual (PVR) by Bladder Scan: In order to evaluate bladder emptying, we discussed obtaining a postvoid residual and she agreed to this procedure.  Procedure: The ultrasound unit was placed on the patient's abdomen in the suprapubic region after the patient had voided. A PVR of 70 ml was obtained by bladder scan.  Laboratory Results: POC urine: small  leukocytes   ASSESSMENT AND PLAN Deborah Conner is a 72 y.o. with:  1. Prolapse of anterior vaginal wall   2. Vaginal atrophy   3. Urinary frequency   4. Leukocytes in urine   5. Incontinence  of feces, unspecified fecal incontinence type    Prolapse - Did not get a clear picture of the extent of her prolapse today due to recent pessary use, appears mostly anterior. - We discussed options for surgery and since she is not planning on being sexually active, a colpocleisis would be least invasive and most successful. Handout provided on this option.  - For now, she will continue with her ring pessary. Recommended removing once a week for cleaning.  - If she wants to proceed with surgery, then will plan for her to remove the pessary for a few days and return for another exam to get a clearer picture of the prolapse.  - She is not sure if she wants to come here for her pessary cleanings or back to her GYN. She will make an appt if she decides.   2. Vaginal atrophy - continue with vaginal estrogen twice a week, can concentrate some near the opening  3. Leukocytes in urine - will send for culture  4. Accidental Bowel Leakage:  - Treatment options include anti-diarrhea medication (loperamide/ Imodium OTC or prescription lomotil), fiber supplements, physical therapy, and possible sacral neuromodulation or surgery.   - Handout provided on SNM. We did discuss starting metamucil in addition to imodium prn.   Return as needed   Marguerita Beards, MD

## 2023-03-30 ENCOUNTER — Telehealth: Payer: Self-pay | Admitting: Internal Medicine

## 2023-03-30 NOTE — Telephone Encounter (Signed)
Inbound call from patient states her ibs is flaring up more than usual. Please advise.   Thank you

## 2023-03-30 NOTE — Telephone Encounter (Signed)
Pt stated that for the last 2 months she feels that her IBS is flaring up more often, Pt states that she alternates from abdominal pain with diarrhea to constipation. Pt states that she is using lactose free yogurt, Sauerkraut, to help regulate her BM's. Pt was scheduled for an office visit on 03/31/2023 at 11:00 AM with Doug Sou PA. Pt made aware. Pt verbalized understanding with all questions answered.

## 2023-03-31 ENCOUNTER — Ambulatory Visit (INDEPENDENT_AMBULATORY_CARE_PROVIDER_SITE_OTHER): Payer: Medicare Other | Admitting: Gastroenterology

## 2023-03-31 ENCOUNTER — Encounter: Payer: Self-pay | Admitting: Gastroenterology

## 2023-03-31 VITALS — BP 120/60 | HR 80 | Ht 64.0 in | Wt 151.8 lb

## 2023-03-31 DIAGNOSIS — K580 Irritable bowel syndrome with diarrhea: Secondary | ICD-10-CM | POA: Diagnosis not present

## 2023-03-31 NOTE — Patient Instructions (Signed)
Start Benefiber 2 teaspoons in 8 ounces of liquid daily and may increase to twice daily if needed.  Send Mychart message in a few weeks with an update.   _______________________________________________________  If your blood pressure at your visit was 140/90 or greater, please contact your primary care physician to follow up on this.  _______________________________________________________  If you are age 74 or older, your body mass index should be between 23-30. Your Body mass index is 26.06 kg/m. If this is out of the aforementioned range listed, please consider follow up with your Primary Care Provider.  If you are age 33 or younger, your body mass index should be between 19-25. Your Body mass index is 26.06 kg/m. If this is out of the aformentioned range listed, please consider follow up with your Primary Care Provider.   ________________________________________________________  The [facility] providers would like to encourage you to use [facility] to communicate with providers for non-urgent requests or questions.  Due to long hold times on the telephone, sending your provider a message by [facility] may be a faster and more efficient way to get a response.  Please allow 48 business hours for a response.  Please remember that this is for non-urgent requests.  _______________________________________________________

## 2023-03-31 NOTE — Progress Notes (Addendum)
03/31/2023 Deborah Conner [id number] 08-Aug-1951   HISTORY OF PRESENT ILLNESS: This is a 72 year old female who is a patient of Dr. Marvell Fuller.  She follows here for issues with her IBS-D and with history of Schatzki's ring and hiatal hernia.  She does have history of SIBO.  She is eating probiotic foods to help with her gut function.  She does have some issues with diarrhea and urgency alternating with occasional constipation.  She does follow with urogynecology and has a pessary in place.  She has seen pelvic floor physical therapy in the past.  Colonoscopy September 2017 was unremarkable.  Uses Imodium as needed.   Past Medical History:  Diagnosis Date   Anal fissure    Anxiety and depression 1998/1973   some panic   Aortic sclerosis 2005   Diverticulosis 2003   GERD (gastroesophageal reflux disease) 2001   Gestational diabetes    Glucose intolerance (impaired glucose tolerance) 2000   Heart murmur    HLD (hyperlipidemia)    IBS (irritable bowel syndrome) 1998   Insomnia 2003   Internal hemorrhoids    Lactose intolerance 1998   Migraine headache childhood   Osteoporosis 2010   Polyarthralgia    Rosacea 1975   Seborrheic dermatitis age 49   Small intestinal bacterial overgrowth 08/25/2018   Tinnitus 1999   Uterine prolapse, 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   Past Surgical History:  Procedure Laterality Date   CATARACT EXTRACTION Right 10/2021   with lens implant, had to have 2 surgeries b/c part of cataract broke off   COLONOSCOPY  2003, 04/11/2011   20012: diminutive polyp, diverticulosis, internal hemorrhoids   UPPER GASTROINTESTINAL ENDOSCOPY  01/30/2006, 04/11/2011   2007 and 2012:Normal     reports that she has never smoked. She has never used smokeless tobacco. She reports that she does not drink alcohol and does not use drugs. family history includes Arthritis in her sister; Breast cancer in her sister; Diabetes in her sister; Gout in her mother; Hearing loss in her  father; Heart disease in her mother; Hypertension in her mother; Osteoporosis in her mother; Raynaud syndrome in her daughter. Allergies  Allergen Reactions   Other Itching   Sulfa Antibiotics Itching   Sulfasalazine Itching   Alendronate Sodium Other (See Comments)   Lactose    Lactose Intolerance (Gi)    Chlorthalidone Rash      Outpatient Encounter Medications as of 03/31/2023  Medication Sig   acyclovir ointment (ZOVIRAX) 5 % as needed   ALPRAZolam (XANAX) 0.25 MG tablet Take 0.125 mg by mouth as needed.    betamethasone valerate ointment (VALISONE) 0.1 %    Calcium-Vitamin D (CALTRATE 600 PLUS-VIT D PO) Take by mouth.   clobetasol ointment (TEMOVATE) 0.05 %    desonide (DESOWEN) 0.05 % cream    FLUOCINOLONE ACETONIDE SCALP 0.01 % OIL    fluocinonide (LIDEX) 0.05 % external solution fluocinonide 0.05 % topical solution   furosemide (LASIX) 20 MG tablet Take 20 mg by mouth every other day.    influenza vaccine adjuvanted (FLUAD QUADRIVALENT) 0.5 ML injection Inject 0.5 mLs into the muscle.   JANUVIA 100 MG tablet Take 100 mg by mouth every morning.   losartan (COZAAR) 50 MG tablet Take 50 mg by mouth every morning.   metFORMIN (GLUCOPHAGE-XR) 500 MG 24 hr tablet metformin ER 500 mg tablet,extended release 24 hr  TK 1 T PO BID PC   Polyethyl Glycol-Propyl Glycol (SYSTANE OP) Place into both eyes as needed.  pravastatin (PRAVACHOL) 10 MG tablet pravastatin 10 mg tablet  TK 1 T PO Q NIGHT PC   prednisoLONE acetate (PRED FORTE) 1 % ophthalmic suspension On a taper dosage   PRESCRIPTION MEDICATION CREAMS FOR SEBORRHEIC DERMATITIS   tocilizumab (ACTEMRA) 400 MG/20ML SOLN injection See admin instructions. Injection weekly   UNABLE TO FIND fluocinolone 0.01 % scalp oil and shower cap   valACYclovir (VALTREX) 500 MG tablet as needed.    VITAMIN D PO Take 500 mg by mouth daily.   No facility-administered encounter medications on file as of 03/31/2023.    REVIEW OF SYSTEMS  : All  other systems reviewed and negative except where noted in the History of Present Illness.   PHYSICAL EXAM: BP 120/60   Pulse 80   Ht 5\' 4"  (1.626 m)   Wt 151 lb 12.8 oz (68.9 kg)   BMI 26.06 kg/m  General: Well developed female in no acute distress Head: Normocephalic and atraumatic Eyes:  Sclerae anicteric, conjunctiva pink. Ears: Normal auditory acuity Lungs: Clear throughout to auscultation; no W/R/R. Heart: Regular rate and rhythm; no M/R/G. Abdomen: Soft, non-distended.  BS present.  Non-tender. Musculoskeletal: Symmetrical with no gross deformities  Skin: No lesions on visible extremities Extremities: No edema  Neurological: Alert oriented x 4, grossly non-focal Psychological:  Alert and cooperative. Normal mood and affect  ASSESSMENT AND PLAN: *IBS with diarrhea: She uses probiotic foods.  She does obviously have pelvic floor weakness as she follows with urogynecology and has a pessary, etc. as well.  She has seen pelvic floor physical therapy in the past, but is open to possibly go back again if needed.  She may benefit from Benefiber powder as a bulking agent.  I recommend starting with 2 teaspoons once daily and increasing to twice daily if needed and tolerated.  Continue Imodium on as-needed basis.  Metformin likely contributing to symptoms as well.   CC:  Osborn Coho, MD

## 2023-04-21 ENCOUNTER — Telehealth: Payer: Self-pay

## 2023-04-21 NOTE — Telephone Encounter (Signed)
Patient called stating that she has a callous near her small toe on the bottom of her foot. The callous is not broken or infected. She just wants to know if it is okay for her to use callous pads on it.

## 2023-04-22 NOTE — Telephone Encounter (Signed)
I spoke with patient and she understood.

## 2023-04-22 NOTE — Telephone Encounter (Signed)
Pilar Plate, DPM  to Me     04/22/23  8:31 AM Do NOT recommend medicated callus pads, non medicated are ok though

## 2023-06-01 ENCOUNTER — Telehealth: Payer: Self-pay | Admitting: Gastroenterology

## 2023-06-01 NOTE — Telephone Encounter (Signed)
Left message on machine to call back  

## 2023-06-01 NOTE — Telephone Encounter (Signed)
Patient called said she wanted to speak with a nurse and follow up.

## 2023-06-02 NOTE — Telephone Encounter (Signed)
The pt last seen 03/31/23 and was told to call with an update.    Cramping and bloating- gurgling continues  Soft stool on Saturday then 10 min later diarrhea- she took 2 imodium and that helped but now she has constipation. On 2 teaspoons bene fiber once daily as requested.  Please advise

## 2023-06-03 ENCOUNTER — Other Ambulatory Visit: Payer: Self-pay

## 2023-06-03 MED ORDER — HYOSCYAMINE SULFATE 0.125 MG SL SUBL: 0.1250 mg | SUBLINGUAL_TABLET | Freq: Four times a day (QID) | SUBLINGUAL | 1 refills | Status: AC | PRN

## 2023-06-03 NOTE — Telephone Encounter (Signed)
Left message on machine to call back  

## 2023-06-03 NOTE — Telephone Encounter (Signed)
The pt has been advised of the recommendations.  She agrees to try levsin and will call back if this does not help.  She will also increase benefiber to 2 teaspoons twice daily.  Prescription has been sent as ordered

## 2023-06-05 ENCOUNTER — Ambulatory Visit: Payer: Medicare Other | Admitting: Podiatry

## 2023-06-15 ENCOUNTER — Ambulatory Visit: Payer: Medicare Other | Admitting: Obstetrics and Gynecology

## 2023-06-19 ENCOUNTER — Ambulatory Visit (INDEPENDENT_AMBULATORY_CARE_PROVIDER_SITE_OTHER): Payer: Medicare Other | Admitting: Podiatry

## 2023-06-19 ENCOUNTER — Encounter: Payer: Self-pay | Admitting: Podiatry

## 2023-06-19 DIAGNOSIS — B351 Tinea unguium: Secondary | ICD-10-CM

## 2023-06-19 DIAGNOSIS — M79675 Pain in left toe(s): Secondary | ICD-10-CM

## 2023-06-19 DIAGNOSIS — Q828 Other specified congenital malformations of skin: Secondary | ICD-10-CM | POA: Diagnosis not present

## 2023-06-19 DIAGNOSIS — E1142 Type 2 diabetes mellitus with diabetic polyneuropathy: Secondary | ICD-10-CM | POA: Diagnosis not present

## 2023-06-19 DIAGNOSIS — M79674 Pain in right toe(s): Secondary | ICD-10-CM | POA: Diagnosis not present

## 2023-06-19 MED ORDER — UREA 10 % EX CREA: TOPICAL_CREAM | CUTANEOUS | 0 refills | Status: AC | PRN

## 2023-06-19 NOTE — Progress Notes (Signed)
  Subjective:  Patient ID: Deborah Conner, female    DOB: [birth year],  MRN: [id number]  Chief Complaint  Patient presents with   Diabetes    PATIENT STATES THAT HER LEFT CALLOUSES AND WALKING ON IT BURNS WHEN SHE WALKS . SHE HAS BEEN KEEPING THE PADS ON UNDER HER LF WHERE CALLOUSES IS . NO OTHER PAIN OTHER THEN THAT .  PATIENT STATES SHE WILL ALSO LIKE HER NAILS TO BE CUT    72 y.o. female presents with the above complaint. History confirmed with patient. Patient presenting with pain related to dystrophic thickened elongated nails. Patient is unable to trim own nails related to nail dystrophy and/or mobility issues. Patient does  have a history of T2DM.  Patient does have painful callus present plantar aspect fifth met head and dorsal aspect of the fifth toe  Objective:  Physical Exam: warm, good capillary refill nail exam onychomycosis of the toenails, onycholysis, and dystrophic nails DP pulses palpable, PT pulses palpable, and protective sensation intact Left Foot:  Pain with palpation of nails due to elongation and dystrophic growth.  Hyperkeratotic lesion with central hyperkeratotic core present on dorsal lateral aspect of the fifth toe PIPJ as well as the plantar aspect of the fifth metatarsal head pain with palpation of these regions Right Foot: Pain with palpation of nails due to elongation and dystrophic growth.   Assessment:   1. Porokeratosis   2. Pain due to onychomycosis of toenails of both feet   3. DM type 2 with diabetic peripheral neuropathy (HCC)     Plan:  Patient was evaluated and treated and all questions answered.  # Porokeratosis/hyperkeratotic lesion subfifth metatarsal head and dorsal aspect fifth toe All symptomatic hyperkeratoses x 2 were safely debrided with a sterile #15 blade to patient's level of comfort without incident. We discussed preventative and palliative care of these lesions including supportive and accommodative shoegear, padding,  prefabricated and custom molded accommodative orthoses, use of a pumice stone and lotions/creams daily.   #Onychomycosis with pain  -Nails palliatively debrided as below. -Educated on self-care  Procedure: Nail Debridement Rationale: Pain Type of Debridement: manual, sharp debridement. Instrumentation: Nail nipper, rotary burr. Number of Nails: 10  Return in about 3 months (around 09/19/2023) for [facility].         Corinna Gab, DPM [facility] / [facility]

## 2023-07-13 ENCOUNTER — Telehealth: Payer: Self-pay | Admitting: Gastroenterology

## 2023-07-13 NOTE — Telephone Encounter (Signed)
Left message on machine to call back  

## 2023-07-13 NOTE — Telephone Encounter (Signed)
Inbound call from patient, states she is having an IBS episode. She states she is having pain under her Left breast, she states she is now experiencing burning. She states she was told by Dr. Leone Payor she had a hernia and she would like to discuss further care due to it now bothering her. Please advise.

## 2023-07-14 NOTE — Telephone Encounter (Signed)
Left message on machine to call back  

## 2023-07-15 NOTE — Telephone Encounter (Signed)
Left message on machine to call back   Unable to reach pt by phone will await further communication from the pt

## 2023-07-16 ENCOUNTER — Ambulatory Visit: Payer: Medicare Other | Admitting: Obstetrics and Gynecology

## 2023-07-28 ENCOUNTER — Encounter: Payer: Self-pay | Admitting: Internal Medicine

## 2023-07-28 ENCOUNTER — Ambulatory Visit: Payer: Medicare Other | Admitting: Internal Medicine

## 2023-07-28 VITALS — BP 122/70 | HR 75 | Ht 64.0 in | Wt 158.0 lb

## 2023-07-28 DIAGNOSIS — K580 Irritable bowel syndrome with diarrhea: Secondary | ICD-10-CM | POA: Diagnosis not present

## 2023-07-28 DIAGNOSIS — K449 Diaphragmatic hernia without obstruction or gangrene: Secondary | ICD-10-CM

## 2023-07-28 NOTE — Patient Instructions (Addendum)
Speak with PCP about changing (Pravastatin) cholesterol medications.   Use Hyoscyamine for RUQ pain.   Follow up as needed.  _______________________________________________________  If your blood pressure at your visit was 140/90 or greater, please contact your primary care physician to follow up on this.  _______________________________________________________  If you are age 74 or older, your body mass index should be between 23-30. Your Body mass index is 27.12 kg/m. If this is out of the aforementioned range listed, please consider follow up with your Primary Care Provider.  If you are age 32 or younger, your body mass index should be between 19-25. Your Body mass index is 27.12 kg/m. If this is out of the aformentioned range listed, please consider follow up with your Primary Care Provider.   ________________________________________________________  The [place] providers would like to encourage you to use [facility] to communicate with providers for non-urgent requests or questions.  Due to long hold times on the telephone, sending your provider a message by [facility] may be a faster and more efficient way to get a response.  Please allow 48 business hours for a response.  Please remember that this is for non-urgent requests.  _______________________________________________________  I appreciate the opportunity to care for you. Stan Head, MD, [facility]

## 2023-07-28 NOTE — Progress Notes (Signed)
Deborah Conner 72 y.o. 1951/02/05 [id number]  Assessment & Plan:   Encounter Diagnoses  Name Primary?   Irritable bowel syndrome with diarrhea Yes   Hiatal hernia    I think her symptoms are consistent with her prior IBS and she is reassured.  This right upper quadrant discomfort is relatively minor I think.  It could be related to her hiatal hernia perhaps.  She will try using her hyoscyamine to see if it provides relief and continue her other treatment and follow-up as needed.  She will ask her PCP about changing her pravastatin as she thinks that is causing side effects with loose stools and gas.     Subjective:   Chief Complaint: IBS follow-up  HPI 72 year old woman with a long history of IBS-D also prior history of SIBO.  Last seen by Doug Sou PA-C 03/31/2023.  At that time having a lot of diarrhea Imodium as needed and Benefiber recommended.  Patient called back in October and hyoscyamine was prescribed.  Not yet used.   Discussed the use of AI scribe software for clinical note transcription with the patient, who gave verbal consent to proceed.    The patient, with a history of irritable bowel syndrome (IBS) and hiatal hernia, presents with recurrent pain in RUQ, in the same location as previously experienced. The pain is described as a brief, mild discomfort that occurs post-defecation and sometimes after dinner. The patient notes that the pain does not persist throughout the day and can sometimes be relieved by belching.  The patient has been managing her IBS with Benefiber, taken once daily 2 teaspoons), which has improved her previously frequent diarrhea.  She continues to eat probiotic foods.  However, she reports that Benefiber seems to increase gas production. The patient also takes Pravastatin at night, which she believes contributes to loose stools in the morning and gas.  The patient has been prescribed Levsin (hyoscyamine) for her current discomfort,  although she has not yet tried it. The patient's primary concern is to ensure she is managing her condition appropriately and to understand the cause of her recurrent discomfort.     CT angio chest aorta with and without contrast media 01/17/2021-no findings of vasculitis, mild atherosclerosis and associated coronary artery disease were seen, no chest or upper abdominal findings otherwise. Colonoscopy 2017 severe diverticulosis EGD 02/14/2021, mild Schatzki ring dilated to 20 mm no change, 5 cm hiatal hernia slight retention of food stomach and duodenum Allergies  Allergen Reactions   Other Itching   Sulfa Antibiotics Itching   Sulfasalazine Itching   Alendronate Sodium Other (See Comments)   Lactose    Lactose Intolerance (Gi)    Chlorthalidone Rash   Current Meds  Medication Sig   acyclovir ointment (ZOVIRAX) 5 % as needed   ALPRAZolam (XANAX) 0.25 MG tablet Take 0.125 mg by mouth as needed.    FLUOCINOLONE ACETONIDE SCALP 0.01 % OIL    fluocinonide (LIDEX) 0.05 % external solution fluocinonide 0.05 % topical solution   furosemide (LASIX) 20 MG tablet Take 20 mg by mouth every other day.    hyoscyamine (LEVSIN/SL) 0.125 MG SL tablet Place 1 tablet (0.125 mg total) under the tongue every 6 (six) hours as needed.   influenza vaccine adjuvanted (FLUAD QUADRIVALENT) 0.5 ML injection Inject 0.5 mLs into the muscle.   JANUVIA 100 MG tablet Take 100 mg by mouth every morning.   losartan (COZAAR) 50 MG tablet Take 50 mg by mouth every morning.   metFORMIN (GLUCOPHAGE-XR)  500 MG 24 hr tablet metformin ER 500 mg tablet,extended release 24 hr  TK 1 T PO BID PC   Polyethyl Glycol-Propyl Glycol (SYSTANE OP) Place into both eyes as needed.     pravastatin (PRAVACHOL) 10 MG tablet pravastatin 10 mg tablet  TK 1 T PO Q NIGHT PC   prednisoLONE acetate (PRED FORTE) 1 % ophthalmic suspension On a taper dosage   PRESCRIPTION MEDICATION CREAMS FOR SEBORRHEIC DERMATITIS   tocilizumab (ACTEMRA) 400 MG/20ML  SOLN injection See admin instructions. Injection weekly, pt is taking injections every other week   UNABLE TO FIND fluocinolone 0.01 % scalp oil and shower cap   urea (CARMOL) 10 % cream Apply topically as needed.   valACYclovir (VALTREX) 500 MG tablet as needed.    VITAMIN D PO Take 500 mg by mouth daily.   Past Medical History:  Diagnosis Date   Anal fissure    Anxiety and depression 1998/1973   some panic   Aortic sclerosis 2005   Diverticulosis 2003   GERD (gastroesophageal reflux disease) 2001   Gestational diabetes    Glucose intolerance (impaired glucose tolerance) 2000   Heart murmur    HLD (hyperlipidemia)    IBS (irritable bowel syndrome) 1998   Insomnia 2003   Internal hemorrhoids    Lactose intolerance 1998   Migraine headache childhood   Osteoporosis 2010   Polyarthralgia    Rosacea 1975   Seborrheic dermatitis age 8   Small intestinal bacterial overgrowth 08/25/2018   Tinnitus 1999   Uterine prolapse, 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   Past Surgical History:  Procedure Laterality Date   CATARACT EXTRACTION Right 10/2021   with lens implant, had to have 2 surgeries b/c part of cataract broke off   COLONOSCOPY  2003, 04/11/2011   20012: diminutive polyp, diverticulosis, internal hemorrhoids   UPPER GASTROINTESTINAL ENDOSCOPY  01/30/2006, 04/11/2011   2007 and 2012:Normal    Social History   Social History Narrative   is married and lives with her husband, he has a severe seizure disorder which is a source of stress   Retired Personal assistant, 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   family history includes Arthritis in her sister; Breast cancer in her sister; Diabetes in her sister; Gout in her mother; Hearing loss in her father; Heart disease in her mother; Hypertension in her mother; Osteoporosis in her mother; Raynaud syndrome in her daughter.   Review of Systems As per HPI  Objective:    Physical Exam BP 122/70   Pulse 75   Ht 5\' 4"  (1.626 m)   Wt 158 lb (71.7 kg)   BMI 27.12 kg/m  Abdomen is soft and nontender and so are the ribs

## 2023-09-23 ENCOUNTER — Ambulatory Visit (INDEPENDENT_AMBULATORY_CARE_PROVIDER_SITE_OTHER): Payer: Medicare Other | Admitting: Obstetrics and Gynecology

## 2023-09-23 ENCOUNTER — Encounter: Payer: Self-pay | Admitting: Obstetrics and Gynecology

## 2023-09-23 VITALS — BP 128/81 | HR 76

## 2023-09-23 DIAGNOSIS — N812 Incomplete uterovaginal prolapse: Secondary | ICD-10-CM | POA: Diagnosis not present

## 2023-09-23 DIAGNOSIS — N3941 Urge incontinence: Secondary | ICD-10-CM | POA: Diagnosis not present

## 2023-09-23 DIAGNOSIS — N952 Postmenopausal atrophic vaginitis: Secondary | ICD-10-CM | POA: Diagnosis not present

## 2023-09-23 MED ORDER — ESTRADIOL 0.1 MG/GM VA CREA: TOPICAL_CREAM | VAGINAL | 11 refills | Status: AC

## 2023-09-23 NOTE — Assessment & Plan Note (Signed)
-   Continue with #4 RWS pessary. She will remove every 1-2 weeks.

## 2023-09-23 NOTE — Patient Instructions (Signed)
Use a pea sized amount of vaginal estrogen twice a week. Place with finger into vagina.

## 2023-09-23 NOTE — Assessment & Plan Note (Signed)
-   Advised to increase estrogen to twice a week and try to get higher into the vagina. New Rx for estrace ordered.  - We discussed that if abrasions improve but if she still has bleeding, should consider pelvic US

## 2023-09-23 NOTE — Assessment & Plan Note (Signed)
-   Likely due to increased urine production from new BP regimen as previously was not having issues before adding medication.  - Recommended pelvic PT, referral placed.

## 2023-09-23 NOTE — Progress Notes (Signed)
[facility] Return Visit  SUBJECTIVE  History of Present Illness: Deborah Conner is a 73 y.o. female seen in follow-up for prolapse and ABL.  Went to Dr Deborah Conner and got a smaller pessary (#4 ring). Noticed some vaginal bleeding about 2 weeks ago. Has been more active recently.  She is only using the estrogen cream when placing the pessary q 2 weeks. Overall she feels the pessary is working well for her. Feels this smaller size is easier to remove and replace.   Has a little more urinary urgency since adding losartan with the furosemide. Has a little leakage with urgency lately. She has done pelvic PT back in 2020.   She has been using benefiber and has noticed an improvement with bowel leakage.   Past Medical History: Patient  has a past medical history of Anal fissure, Anxiety and depression (1998/1973), Aortic sclerosis (2005), Diverticulosis (2003), GERD (gastroesophageal reflux disease) (2001), Gestational diabetes, Glucose intolerance (impaired glucose tolerance) (2000), Heart murmur, HLD (hyperlipidemia), IBS (irritable bowel syndrome) (1998), Insomnia (2003), Internal hemorrhoids, Lactose intolerance (1998), Migraine headache (childhood), Osteoporosis (2010), Polyarthralgia, Rosacea (1975), Seborrheic dermatitis (age 63), Small intestinal bacterial overgrowth (08/25/2018), Tinnitus (1999), and Uterine prolapse, Pessary tx (12/09/2011).   Past Surgical History: She  has a past surgical history that includes Upper gastrointestinal endoscopy (01/30/2006, 04/11/2011); Colonoscopy (2003, 04/11/2011); and Cataract extraction (Right, 10/2021).   Medications: She has a current medication list which includes the following prescription(s): acyclovir ointment, alprazolam, [START ON 09/24/2023] estradiol, fluocinolone acetonide scalp, fluocinonide, furosemide, hyoscyamine, fluad quadrivalent, januvia, losartan, metformin, polyethyl glycol-propyl glycol, pravastatin, prednisolone  acetate, PRESCRIPTION MEDICATION, actemra, UNABLE TO FIND, urea, valacyclovir, and vitamin d.   Allergies: Patient is allergic to other, sulfa antibiotics, sulfasalazine, alendronate sodium, lactose, lactose intolerance (gi), and chlorthalidone.   Social History: Patient  reports that she has never smoked. She has never used smokeless tobacco. She reports that she does not drink alcohol and does not use drugs.     OBJECTIVE     Physical Exam: Vitals:   09/23/23 0929  BP: 128/81  Pulse: 76   Gen: No apparent distress, A&O x 3.  Detailed Urogynecologic Evaluation:  Pessary removed. On speculum, abrasions noted on the posterior vaginal apex, treated with silver nitrate. Pessary replaced without difficulty.    ASSESSMENT AND PLAN    Deborah Conner is a 73 y.o. with:  1. Uterovaginal prolapse, incomplete   2. Vaginal atrophy   3. Urge incontinence     Uterovaginal prolapse, incomplete Assessment & Plan: - Continue with #4 RWS pessary. She will remove every 1-2 weeks.    Vaginal atrophy Assessment & Plan: - Advised to increase estrogen to twice a week and try to get higher into the vagina. New Rx for estrace ordered.  - We discussed that if abrasions improve but if she still has bleeding, should consider pelvic US  Orders: -     Estradiol; Place 0.5g twice a week at night  Dispense: 42.5 g; Refill: 11  Urge incontinence Assessment & Plan: - Likely due to increased urine production from new BP regimen as previously was not having issues before adding medication.  - Recommended pelvic PT, referral placed.   Orders: -     AMB referral to rehabilitation  Return 6 months for pessary check or sooner if needed   Marguerita Beards, MD

## 2023-10-01 ENCOUNTER — Ambulatory Visit: Payer: Medicare Other | Admitting: Podiatry

## 2023-10-12 ENCOUNTER — Ambulatory Visit (INDEPENDENT_AMBULATORY_CARE_PROVIDER_SITE_OTHER): Payer: Medicare Other | Admitting: Podiatry

## 2023-10-12 DIAGNOSIS — L84 Corns and callosities: Secondary | ICD-10-CM | POA: Diagnosis not present

## 2023-10-12 DIAGNOSIS — B351 Tinea unguium: Secondary | ICD-10-CM

## 2023-10-12 DIAGNOSIS — M79675 Pain in left toe(s): Secondary | ICD-10-CM | POA: Diagnosis not present

## 2023-10-12 DIAGNOSIS — M79674 Pain in right toe(s): Secondary | ICD-10-CM

## 2023-10-12 DIAGNOSIS — E1151 Type 2 diabetes mellitus with diabetic peripheral angiopathy without gangrene: Secondary | ICD-10-CM

## 2023-10-12 NOTE — Progress Notes (Signed)
       Subjective:  Patient ID: Deborah Conner, female    DOB: July 08, 1951,  MRN: [id number]  Deborah Conner presents to clinic today for:  Chief Complaint  Patient presents with   dfc    Nail trim    Patient notes nails are thick and elongated, causing pain in shoe gear when ambulating.  She has painful calluses bilateral submet 5  PCP is Alysia Penna, MD. last seen around 07/22/2023  Past Medical History:  Diagnosis Date   Anal fissure    Anxiety and depression 1998/1973   some panic   Aortic sclerosis 2005   Diverticulosis 2003   GERD (gastroesophageal reflux disease) 2001   Gestational diabetes    Glucose intolerance (impaired glucose tolerance) 2000   Heart murmur    HLD (hyperlipidemia)    IBS (irritable bowel syndrome) 1998   Insomnia 2003   Internal hemorrhoids    Lactose intolerance 1998   Migraine headache childhood   Osteoporosis 2010   Polyarthralgia    Rosacea 1975   Seborrheic dermatitis age 72   Small intestinal bacterial overgrowth 08/25/2018   Tinnitus 1999   Uterine prolapse, 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    Allergies  Allergen Reactions   Other Itching   Sulfa Antibiotics Itching   Sulfasalazine Itching   Alendronate Sodium Other (See Comments)   Lactose    Lactose Intolerance (Gi)    Chlorthalidone Rash    Objective:  Deborah Conner is a pleasant 73 y.o. female in NAD. AAO x 3.  Vascular Examination: Patient has palpable DP pulse, absent PT pulse bilateral.  Delayed capillary refill bilateral toes.  Sparse digital hair bilateral.  Proximal to distal cooling WNL bilateral.    Dermatological Examination: Interspaces are clear with no open lesions noted bilateral.  Skin is shiny and atrophic bilateral.  Nails are 3-29mm thick, with yellowish/brown discoloration, subungual debris and distal onycholysis x10.  There is pain with compression of nails x10.  There are hyperkeratotic lesions noted bilateral submet 5.  Patient qualifies for  at-risk foot care because of diabetes with PVD.  Assessment/Plan: 1. Pain due to onychomycosis of toenails of both feet   2. Callus of foot   3. Type II diabetes mellitus with peripheral circulatory disorder (HCC)     Mycotic nails x10 were sharply debrided with sterile nail nippers and power debriding burr to decrease bulk and length.  Hyperkeratotic lesions b/l submet 5 were shaved with #312 blade.   Return in about 3 months (around 01/12/2024) for RFC.   Clerance Lav, DPM, FACFAS [facility]     [address] [place], [place] [address]                Office [phone]  Fax [phone]

## 2023-12-06 ENCOUNTER — Other Ambulatory Visit: Payer: Self-pay

## 2023-12-06 ENCOUNTER — Emergency Department (HOSPITAL_COMMUNITY)

## 2023-12-06 ENCOUNTER — Encounter (HOSPITAL_COMMUNITY): Payer: Self-pay | Admitting: Emergency Medicine

## 2023-12-06 ENCOUNTER — Emergency Department (HOSPITAL_COMMUNITY)
Admission: EM | Admit: 2023-12-06 | Discharge: 2023-12-07 | Disposition: A | Discharge status: Home / Self Care | Attending: Emergency Medicine | Admitting: Emergency Medicine

## 2023-12-06 DIAGNOSIS — I10 Essential (primary) hypertension: Secondary | ICD-10-CM | POA: Insufficient documentation

## 2023-12-06 DIAGNOSIS — Z79899 Other long term (current) drug therapy: Secondary | ICD-10-CM | POA: Diagnosis not present

## 2023-12-06 LAB — BASIC METABOLIC PANEL WITH GFR
Anion gap: 12 (ref 5–15)
BUN: 18 mg/dL (ref 8–23)
CO2: 21 mmol/L — ABNORMAL LOW (ref 22–32)
Calcium: 9.4 mg/dL (ref 8.9–10.3)
Chloride: 103 mmol/L (ref 98–111)
Creatinine, Ser: 0.92 mg/dL (ref 0.44–1.00)
GFR, Estimated: >60 mL/min (ref >60)
Glucose, Bld: 164 mg/dL — ABNORMAL HIGH (ref 70–99)
Potassium: 3.9 mmol/L (ref 3.5–5.1)
Sodium: 136 mmol/L (ref 135–145)

## 2023-12-06 LAB — URINALYSIS, ROUTINE W REFLEX MICROSCOPIC
Bilirubin Urine: NEGATIVE
Glucose, UA: NEGATIVE mg/dL
Ketones, ur: NEGATIVE mg/dL
Nitrite: NEGATIVE
Protein, ur: NEGATIVE mg/dL
Specific Gravity, Urine: 1.004 — ABNORMAL LOW (ref 1.005–1.030)
pH: 6 (ref 5.0–8.0)

## 2023-12-06 LAB — CBC
HCT: 43.9 % (ref 36.0–46.0)
Hemoglobin: 14.3 g/dL (ref 12.0–15.0)
MCH: 29.5 pg (ref 26.0–34.0)
MCHC: 32.6 g/dL (ref 30.0–36.0)
MCV: 90.5 fL (ref 80.0–100.0)
Platelets: 238 10*3/uL (ref 150–400)
RBC: 4.85 MIL/uL (ref 3.87–5.11)
RDW: 12.4 % (ref 11.5–15.5)
WBC: 8.5 10*3/uL (ref 4.0–10.5)
nRBC: 0 % (ref 0.0–0.2)

## 2023-12-06 LAB — TROPONIN I (HIGH SENSITIVITY): Troponin I (High Sensitivity): 7 ng/L (ref <18)

## 2023-12-06 NOTE — ED Triage Notes (Signed)
 Patient coming to ED for evaluation of hypertension.  Reports after church she started to "fell off and my ears felt full."  Took her BP and it was 209/125.  Called her doctor on-call and was instructed to take another Losartan.  Had slight improved in BP, however it is still elevated. No reports of visual changes, HA, weakness, or chest pain

## 2023-12-07 LAB — TROPONIN I (HIGH SENSITIVITY): Troponin I (High Sensitivity): 11 ng/L (ref <18)

## 2023-12-07 LAB — URINE CULTURE

## 2023-12-07 NOTE — ED Provider Notes (Signed)
 [place] EMERGENCY DEPARTMENT AT [facility] Provider Note   CSN: [id number] Arrival date & time: 12/06/23  2115     History  Chief Complaint  Patient presents with   Hypertension    Deborah Conner is a 73 y.o. female.  The history is provided by the patient.  Patient w/multiple medical conditions including hypertension, previous history of giant cell arteritis presents with elevated blood pressure Patient reports that after church today she felt that her ears were full and she felt "off " No headache, no visual changes.  No vomiting.  No chest pain or shortness of breath.  No focal weakness She checked her blood pressures later in the day and it was elevated.  She called the doctor on-call and instructed to take another dose of losartan.  After speaking to her daughter she recommended being evaluated in the ER. Patient is overall feeling at her baseline.  The only medication adjustment she had was she did take her furosemide later in the day    Past Medical History:  Diagnosis Date   Anal fissure    Anxiety and depression 1998/1973   some panic   Aortic sclerosis 2005   Diverticulosis 2003   GERD (gastroesophageal reflux disease) 2001   Gestational diabetes    Glucose intolerance (impaired glucose tolerance) 2000   Heart murmur    HLD (hyperlipidemia)    IBS (irritable bowel syndrome) 1998   Insomnia 2003   Internal hemorrhoids    Lactose intolerance 1998   Migraine headache childhood   Osteoporosis 2010   Polyarthralgia    Rosacea 1975   Seborrheic dermatitis age 70   Small intestinal bacterial overgrowth 08/25/2018   Tinnitus 1999   Uterine prolapse, 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 Authorizing Provider  acyclovir ointment (ZOVIRAX) 5 % as needed 09/17/16   [provider]  ALPRAZolam (XANAX) 0.25 MG tablet Take 0.125 mg by mouth as needed.      [provider]  estradiol  (ESTRACE ) 0.1 MG/GM vaginal cream Place 0.5g twice a week at night 09/24/23   Arma Lamp, MD  FLUOCINOLONE ACETONIDE SCALP 0.01 % OIL  03/07/16   [provider]  fluocinonide (LIDEX) 0.05 % external solution fluocinonide 0.05 % topical solution    [provider]  furosemide (LASIX) 20 MG tablet Take 20 mg by mouth every other day.  09/29/16   [provider]  hyoscyamine  (LEVSIN/SL) 0.125 MG SL tablet Place 1 tablet (0.125 mg total) under the tongue every 6 (six) hours as needed. 06/03/23   Zehr, Jessica D, PA-C  influenza vaccine adjuvanted (FLUAD QUADRIVALENT ) 0.5 ML injection Inject 0.5 mLs into the muscle. 05/15/22   Liane Redman, MD  JANUVIA 100 MG tablet Take 100 mg by mouth every morning. 01/17/21   [provider]  losartan (COZAAR) 50 MG tablet Take 50 mg by mouth every morning. 01/22/21   [provider]  metFORMIN (GLUCOPHAGE-XR) 500 MG 24 hr tablet metformin ER 500 mg tablet,extended release 24 hr  TK 1 T PO BID PC    [provider]  Polyethyl Glycol-Propyl Glycol (SYSTANE OP) Place into both eyes as needed.      [provider]  pravastatin (PRAVACHOL) 10 MG tablet pravastatin 10 mg tablet  TK 1 T PO Q NIGHT PC    [provider]  prednisoLONE acetate (PRED FORTE) 1 % ophthalmic suspension On a  taper dosage 10/30/21   [provider]  PRESCRIPTION MEDICATION CREAMS FOR SEBORRHEIC DERMATITIS    [provider]  tocilizumab (ACTEMRA) 400 MG/20ML SOLN injection See admin instructions. Injection weekly, pt is taking injections every other week    [provider]  UNABLE TO FIND fluocinolone 0.01 % scalp oil and shower cap    [provider]  urea  (CARMOL) 10 % cream Apply topically as needed. 06/19/23   Standiford, Karlene Overcast, DPM  valACYclovir (VALTREX) 500 MG tablet as needed.  10/03/16   [provider]  VITAMIN D PO Take 500  mg by mouth daily.    [provider]      Allergies    Other, Sulfa antibiotics, Sulfasalazine, Alendronate sodium, Lactose, Lactose intolerance (gi), and Chlorthalidone    Review of Systems   Review of Systems  Eyes:  Negative for visual disturbance.  Cardiovascular:  Negative for chest pain.  Gastrointestinal:  Negative for abdominal pain.  Genitourinary:  Negative for dysuria.       Denies any new urinary symptoms  Neurological:  Negative for speech difficulty, weakness and headaches.    Physical Exam Updated Vital Signs BP (!) 185/82   Pulse 72   Temp 98 F (36.7 C)   Resp 14   Ht 1.626 m (5\' 4" )   Wt 73 kg   SpO2 100%   BMI 27.64 kg/m  Physical Exam CONSTITUTIONAL: Elderly, no acute distress HEAD: Normocephalic/atraumatic EYES: EOMI/PERRL, no nystagmus, no ptosis ENMT: Mucous membranes moist NECK: supple no meningeal signs, no bruits SPINE/BACK:entire spine nontender CV: S1/S2 noted, no murmurs/rubs/gallops noted LUNGS: Lungs are clear to auscultation bilaterally, no apparent distress NEURO:Awake/alert, face symmetric, no arm or leg drift is noted Equal 5/5 strength with shoulder abduction, elbow flex/extension, wrist flex/extension in upper extremities and equal hand grips bilaterally Equal 5/5 strength with hip flexion,knee flex/extension, foot dorsi/plantar flexion Cranial nerves 3/4/5/6/02/16/09/11/12 tested and intact Gait normal without ataxia No past pointing Sensation to light touch intact in all extremities EXTREMITIES: pulses normal, full ROM SKIN: warm, color normal PSYCH: no abnormalities of mood noted, alert and oriented to situation  ED Results / Procedures / Treatments   Labs (all labs ordered are listed, but only abnormal results are displayed) Labs Reviewed  BASIC METABOLIC PANEL WITH GFR - Abnormal; Notable for the following components:      Result Value   CO2 21 (*)    Glucose, Bld 164 (*)    All other components within normal  limits  URINALYSIS, ROUTINE W REFLEX MICROSCOPIC - Abnormal; Notable for the following components:   Color, Urine STRAW (*)    APPearance HAZY (*)    Specific Gravity, Urine 1.004 (*)    Hgb urine dipstick SMALL (*)    Leukocytes,Ua LARGE (*)    Bacteria, UA RARE (*)    All other components within normal limits  URINE CULTURE  CBC  TROPONIN I (HIGH SENSITIVITY)  TROPONIN I (HIGH SENSITIVITY)    EKG EKG Interpretation Date/Time:  Sunday December 06 2023 21:24:05 EDT Ventricular Rate:  85 PR Interval:  188 QRS Duration:  76 QT Interval:  362 QTC Calculation: 430 R Axis:   39  Text Interpretation: Normal sinus rhythm Nonspecific ST abnormality Abnormal ECG No previous ECGs available Confirmed by Eldon Greenland ([id number]) on 12/06/2023 11:43:52 PM  Radiology DG Chest 2 View Result Date: 12/06/2023 CLINICAL DATA:  hypertension EXAM: CHEST - 2 VIEW COMPARISON:  CT angiography chest 01/17/2021, chest x-ray 12/02/2013 FINDINGS: The heart  and mediastinal contours are unchanged. Hyperinflation of the lungs. Biapical pleural/pulmonary scarring. No focal consolidation. Chronic coarsened interstitial markings with no overt pulmonary edema. Pleural effusion. No pneumothorax. No acute osseous abnormality. IMPRESSION: Bronchitis changes with question underlying emphysema. Electronically Signed   By: Morgane  Naveau M.D.   On: 12/06/2023 22:06    Procedures Procedures    Medications Ordered in ED Medications - No data to display  ED Course/ Medical Decision Making/ A&P                                 Medical Decision Making Amount and/or Complexity of Data Reviewed Labs: ordered. Radiology: ordered.   This patient presents to the ED for concern of elevated blood pressure, this involves an extensive number of treatment options, and is a complaint that carries with it a high risk of complications and morbidity.  The differential diagnosis includes but is not limited to CVA, intracranial  hemorrhage, acute coronary syndrome, renal failure, aortic dissection    Comorbidities that complicate the patient evaluation: Patient's presentation is complicated by their history of hypertension  Social Determinants of Health: Patient's  lives alone   increases the complexity of managing their presentation  Additional history obtained: Additional history obtained from family and discussed with daughter via the phone  Lab Tests: I Ordered, and personally interpreted labs.  The pertinent results include: Mild hyperglycemia  Imaging Studies ordered: I ordered imaging studies including X-ray chest   I independently visualized and interpreted imaging which showed no acute findings I agree with the radiologist interpretation  Test Considered: Since patient is without any symptoms, blood pressures improving, will defer further workup at this time  Reevaluation: After the interventions noted above, I reevaluated the patient and found that they have :improved  Complexity of problems addressed: Patient's presentation is most consistent with  acute presentation with potential threat to life or bodily function  Disposition: After consideration of the diagnostic results and the patient's response to treatment,  I feel that the patent would benefit from discharge   .   Patient with elevated blood pressure without any significant symptoms.  No signs of hypertensive emergency.  Patient safe for discharge home Will call her PCP tomorrow for further instructions.  Patient without urinary symptoms, will send urinalysis for culture        Final Clinical Impression(s) / ED Diagnoses Final diagnoses:  Primary hypertension    Rx / DC Orders ED Discharge Orders     None         Eldon Greenland, MD 12/07/23 [phone]

## 2023-12-11 ENCOUNTER — Telehealth: Payer: Self-pay

## 2023-12-11 ENCOUNTER — Encounter: Payer: Self-pay | Admitting: Obstetrics and Gynecology

## 2023-12-11 NOTE — Telephone Encounter (Signed)
 From Deborah Conner is a 73 y.o. female:  Good morning Dr. Frutoso Jing,  I was seen in [facility] ER on Sunday evening because of elevated blood pressure, but in doing tests they found I had a UTI. Yesterday [facility] sent an email and it showed I had an infection on 10/23/2023,during my annual exam, but I didn't know it. I was given a script for Nitrofurantoin by my family doctor.  I would like for you to take a look at my labs from [facility] and possible [facility], to make sure that I don't need to have more work up done . I know You treat me for pelvic floor issues but it seems I am having erratic issues with my urinary tract and I really want to make sure I stay on top of it.  Thank you,  Deborah Conner.   [birth year]

## 2023-12-11 NOTE — Telephone Encounter (Signed)
 Deborah Conner is a 73 y.o. female is requesting DR.Frutoso Jing look at her urine cultures fron [facility] and [facility] to see if she need medication for a UTI. I told the patient we need to have the results from [facility]. Fax number was give to the patient to have labs faxed over.

## 2023-12-14 NOTE — Telephone Encounter (Signed)
 Pt was contacted and notified of the message from Dr. Frutoso Jing. Pt said she not having any symptoms at this time. Pt was encouraged to schedule a nurse visit if here symptoms return. Pt verbalized understanding

## 2023-12-22 ENCOUNTER — Encounter: Payer: Medicare Other | Admitting: Physical Therapy

## 2023-12-28 NOTE — Therapy (Signed)
 OUTPATIENT PHYSICAL THERAPY FEMALE PELVIC EVALUATION   Patient Name: Deborah Conner MRN: [id number] DOB:1951/05/31, 73 y.o., female Today's Date: 12/29/2023  END OF SESSION:  PT End of Session - 12/29/23 0938     Visit Number 1    Date for PT Re-Evaluation 03/22/24    Authorization Type medicare, aetna, tricare    Authorization - Visit Number 1    Authorization - Number of Visits 10    PT Start Time 0930    PT Stop Time 1025    PT Time Calculation (min) 55 min    Activity Tolerance Patient tolerated treatment well    Behavior During Therapy [facility] for tasks assessed/performed             Past Medical History:  Diagnosis Date   Anal fissure    Anxiety and depression 1998/1973   some panic   Aortic sclerosis 2005   Diverticulosis 2003   GERD (gastroesophageal reflux disease) 2001   Gestational diabetes    Glucose intolerance (impaired glucose tolerance) 2000   Heart murmur    HLD (hyperlipidemia)    IBS (irritable bowel syndrome) 1998   Insomnia 2003   Internal hemorrhoids    Lactose intolerance 1998   Migraine headache childhood   Osteoporosis 2010   Polyarthralgia    Rosacea 1975   Seborrheic dermatitis age 77   Small intestinal bacterial overgrowth 08/25/2018   Tinnitus 1999   Uterine prolapse, 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   Past Surgical History:  Procedure Laterality Date   CATARACT EXTRACTION Right 10/2021   with lens implant, had to have 2 surgeries b/c part of cataract broke off   COLONOSCOPY  2003, 04/11/2011   20012: diminutive polyp, diverticulosis, internal hemorrhoids   UPPER GASTROINTESTINAL ENDOSCOPY  01/30/2006, 04/11/2011   2007 and 2012:Normal    Patient Active Problem List   Diagnosis Date Noted   Uterovaginal prolapse, incomplete 09/23/2023   Vaginal atrophy 09/23/2023   Urge incontinence 09/23/2023   Newly diagnosed diabetes (HCC) 02/15/2021   Anxiety about health in pandemic 06/09/2019   Small intestinal bacterial overgrowth 08/25/2018    Uterine prolapse, 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   Menopausal symptoms 12/09/2011   Pelvic pressure in female 12/09/2011   Overweight 12/09/2011   Osteoporosis 12/09/2011   Other dysphagia 02/24/2011   Unspecified hemorrhoids with other complication 02/24/2011   IBS (irritable bowel syndrome) 02/24/2011   Lactose intolerance 02/24/2011    PCP: Barnetta Liberty, MD  REFERRING PROVIDER: Arma Lamp, MD   REFERRING DIAG: N39.41 (ICD-10-CM) - Urge incontinence   THERAPY DIAG:  Muscle weakness (generalized) - Plan: PT plan of care cert/re-cert  Other lack of coordination - Plan: PT plan of care cert/re-cert  Rationale for Evaluation and Treatment: Rehabilitation  ONSET DATE: 08/2023  SUBJECTIVE:  SUBJECTIVE STATEMENT: Patient is having more frequent UTI's, Patient wears a pessary and knows how to take it out. Uses estrogen cream 2 times per week.  Fluid intake:   PAIN:  Are you having pain? No  PRECAUTIONS: Other: osteoporosis  RED FLAGS: None   WEIGHT BEARING RESTRICTIONS: No  FALLS:  Has patient fallen in last 6 months? No  OCCUPATION: retired  ACTIVITY LEVEL : walking 2-3 times per week  PLOF: Independent  PATIENT GOALS: reduce the urgency and leakage  PERTINENT HISTORY:  Anal fissure; Diverticulosis; IBS; Osteoporosis;   URINATION: Pain with urination: No, sometimes some burning Fully empty bladder: Yes: most of the times but in the mornings has to sit for 1-2 minutes or will double void Stream: Strong and most of the time Urgency: Yes  Frequency: night wakes up 1 time; during the day every 2-3 hours, will go more often when she takes her water pill Leakage: Urge to void, Walking to the bathroom, Lifting, and vacuuming, stooping to get something, leak in the morning prior to  getting up out of bed Pads: Yes: day 2-3 liners, night 1 liner; when goes out will where a depends or when traveling  INTERCOURSE:not active   PREGNANCY: Vaginal deliveries 2 Tearing Yes: with first child   PROLAPSE: Wears a pessary  OBJECTIVE:  Note: Objective measures were completed at Evaluation unless otherwise noted.  DIAGNOSTIC FINDINGS:  none  PATIENT SURVEYS:  PFIQ-7: 50 UIQ-7: 52  COGNITION: Overall cognitive status: Within functional limits for tasks assessed     SENSATION: Light touch: Appears intact   FUNCTIONAL TESTS:  5 times sit to stand: 12 sec SLS: hold 8 seconds each leg with no trendelenburg; squat but loses her balance   POSTURE: rounded shoulders, forward head, and increased thoracic kyphosis   LUMBARAROM/PROM: lumbar ROM limited by 25%   LOWER EXTREMITY ZOX:[id number] hip ROM is full   LOWER EXTREMITY MMT:  MMT Right eval Left eval  Hip flexion 4/5 4/5  Hip extension 4/5 4/5  Hip abduction 3/5 3/5   (Blank rows = not tested) PALPATION:               External Perineal Exam: the posterior fourchette is very red. Used a Ship broker for patient to see the area                             Internal Pelvic Floor: tightness along the introitus and perineal body. Some tenderness with palpation to the left levator ani.   Patient confirms identification and approves PT to assess internal pelvic floor and treatment Yes  PELVIC MMT:   MMT eval  Vaginal 1/5  (Blank rows = not tested)        TONE: Average tone of the pelvic floor  PROLAPSE: Anterior wall weakness in supine when bulge  TODAY'S TREATMENT:  DATE: 12/29/23  EVAL Examination completed, findings reviewed, pt educated on POC, HEP, and female pelvic floor anatomy, reasoning with pelvic floor assessment internally with pt consent. Pt motivated to participate in  PT and agreeable to attempt recommendations.     PATIENT EDUCATION:  12/29/23 Education details: educated patient on vaginal moisturizers, how to apply, where to apply it, placing her estrogen cream in the posterior fourchette to reduce the redness Person educated: Patient Education method: Explanation, Demonstration, Tactile cues, Verbal cues, and Handouts Education comprehension: verbalized understanding, returned demonstration, verbal cues required, tactile cues required, and needs further education  HOME EXERCISE PROGRAM: See above.   ASSESSMENT:  CLINICAL IMPRESSION: Patient is a 73 y.o. female who was seen today for physical therapy evaluation and treatment for urge incontinence. Patient reports she leaks urine with  urge to void, walking to the bathroom, lifting,  vacuuming, stooping to get something,  and leak in the morning prior to getting up out of bed. Patient will sit on the commode for 1-2 minutes to compete urination or does double void. She wears 2-3 liners per day and 1 at night. She will wear a depends when she goes out of her house.  Pelvic floor strength is 1/5.  The posterior fourchette is very red. Used a Ship broker for patient to see the area. She has tightness along the introitus and perineal body. Some tenderness with palpation to the left levator ani. Patient is concerned about her balance. She lost her balance trying to squat but was able to regain it and SLS is 8 seconds. Patient will benefit from skilled therapy to improve pelvic floor contraction and coordination while working on her balance to walk to the bathroom safely.   OBJECTIVE IMPAIRMENTS: decreased activity tolerance, decreased balance, decreased coordination, decreased strength, and increased fascial restrictions.   ACTIVITY LIMITATIONS: lifting, bending, squatting, continence, toileting, and locomotion level  PARTICIPATION LIMITATIONS: cleaning, laundry, shopping, and community activity  PERSONAL FACTORS:  Age, Fitness, and 1-2 comorbidities: Anal fissure; Diverticulosis; IBS; Osteoporosis;  are also affecting patient's functional outcome.   REHAB POTENTIAL: Excellent  CLINICAL DECISION MAKING: Evolving/moderate complexity  EVALUATION COMPLEXITY: Moderate   GOALS: Goals reviewed with patient? Yes  SHORT TERM GOALS: Target date: 01/26/24  Patient independent with initial HEP for pelvic floor strength and elongation.  Baseline: Goal status: INITIAL  2.  Patient educated on urge to urinate to reduce urinary urgency.  Baseline:  Goal status: INITIAL  3.  Patient educated on perineal massage to improve tissue mobility.  Baseline:  Goal status: INITIAL  4.  Patient educated patient on vaginal moisturizers to improve health of tissue.  Baseline:  Goal status: INITIAL   LONG TERM GOALS: Target date: 03/22/24  Patient independent with core, hip and pelvic floor strength to continue with continence.  Baseline:  Goal status: INITIAL  2.  Patient is able to use the urge to void behavioral technique to walk to the bathroom slowly and not leak urine.  Baseline:  Goal status: INITIAL  3.  Patient is bend forward and lift an item with urinary leakage decreased >/= 80% with good body mechanics, pressure management and increased pelvic floor strength >/= 3/5.  Baseline:  Goal status: INITIAL  4.  Patient is able to squat without losing her balance to lift items off the floor and without leaking urine using pressure management with pelvic floor strength >/= 3/5.  Baseline:  Goal status: INITIAL  5.  Patient is able to vacuum with no urinary leakage due  to pressure management and increased in pelvic floor strength with increased in hip strength.  Baseline:  Goal status: INITIAL   PLAN:  PT FREQUENCY: 1-2x/week  PT DURATION: 12 weeks  PLANNED INTERVENTIONS: 97110-Therapeutic exercises, 97530- Therapeutic activity, 97112- Neuromuscular re-education, 97535- Self Care, 16109- Manual  therapy, Patient/Family education, Balance training, Dry Needling, Cryotherapy, Moist heat, and Biofeedback  PLAN FOR NEXT SESSION: manual work to the pelvic floor, diaphragmatic breathing, balance exercises   Marsha Skeen, PT 12/29/23 1:00 PM

## 2023-12-29 ENCOUNTER — Other Ambulatory Visit: Payer: Self-pay

## 2023-12-29 ENCOUNTER — Encounter: Payer: Self-pay | Admitting: Physical Therapy

## 2023-12-29 ENCOUNTER — Encounter: Payer: Medicare Other | Attending: Obstetrics and Gynecology | Admitting: Physical Therapy

## 2023-12-29 DIAGNOSIS — M6281 Muscle weakness (generalized): Secondary | ICD-10-CM | POA: Diagnosis present

## 2023-12-29 DIAGNOSIS — R278 Other lack of coordination: Secondary | ICD-10-CM | POA: Diagnosis present

## 2023-12-29 NOTE — Patient Instructions (Signed)
 Moisturizers They are used in the vagina to hydrate the mucous membrane that make up the vaginal canal. Designed to keep a more normal acid balance (ph) Ingredients to avoid is glycerin and fragrance, can increase chance of infection   Creams to use externally on the Vulva area nighly Vulva Balm/ V-magic cream by medicine mama- amazon Julva-amazon Vital "V Wild Yam salve ( help moisturize and help with thinning vulvar area, does have Beeswax [employer] Pro-Meno Wild Yam Cream- [employer] Desert Harvest Gele Cleo by Derenda Flax labial moisturizer (Amazon),  aloe Good Clean Love Enchanted Rose by intimate rose  Things to avoid in the vaginal area Do not use things to irritate the vulvar area No lotions just specialized creams for the vulva area- Neogyn, V-magic,  No soaps; can use Aveeno or Calendula cleanser, unscented Dove if needed. Must be gentle No deodorants No douches Good to sleep without underwear to let the vaginal area to air out No scrubbing: spread the lips to let warm water rinse over labias and pat dry   Deborah Conner, PT [facility] [address], [address] [place], [place] [address] W: [phone] [email or web address]

## 2024-01-05 ENCOUNTER — Encounter: Payer: Medicare Other | Admitting: Physical Therapy

## 2024-01-05 ENCOUNTER — Encounter: Payer: Self-pay | Admitting: Physical Therapy

## 2024-01-05 DIAGNOSIS — M6281 Muscle weakness (generalized): Secondary | ICD-10-CM | POA: Diagnosis not present

## 2024-01-05 DIAGNOSIS — R278 Other lack of coordination: Secondary | ICD-10-CM

## 2024-01-05 NOTE — Therapy (Signed)
 OUTPATIENT PHYSICAL THERAPY FEMALE PELVIC TREATMENT   Patient Name: Deborah Conner MRN: [id number] DOB:[birth year], 73 y.o., female Today's Date: 01/05/2024  END OF SESSION:  PT End of Session - 01/05/24 0940     Visit Number 2    Date for PT Re-Evaluation 03/22/24    Authorization Type medicare, aetna, tricare    Authorization - Visit Number 2    Authorization - Number of Visits 10    PT Start Time 0930    PT Stop Time 1015    PT Time Calculation (min) 45 min    Activity Tolerance Patient tolerated treatment well    Behavior During Therapy [facility] for tasks assessed/performed             Past Medical History:  Diagnosis Date   Anal fissure    Anxiety and depression 1998/1973   some panic   Aortic sclerosis 2005   Diverticulosis 2003   GERD (gastroesophageal reflux disease) 2001   Gestational diabetes    Glucose intolerance (impaired glucose tolerance) 2000   Heart murmur    HLD (hyperlipidemia)    IBS (irritable bowel syndrome) 1998   Insomnia 2003   Internal hemorrhoids    Lactose intolerance 1998   Migraine headache childhood   Osteoporosis 2010   Polyarthralgia    Rosacea 1975   Seborrheic dermatitis age 61   Small intestinal bacterial overgrowth 08/25/2018   Tinnitus 1999   Uterine prolapse, 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   Past Surgical History:  Procedure Laterality Date   CATARACT EXTRACTION Right 10/2021   with lens implant, had to have 2 surgeries b/c part of cataract broke off   COLONOSCOPY  2003, 04/11/2011   20012: diminutive polyp, diverticulosis, internal hemorrhoids   UPPER GASTROINTESTINAL ENDOSCOPY  01/30/2006, 04/11/2011   2007 and 2012:Normal    Patient Active Problem List   Diagnosis Date Noted   Uterovaginal prolapse, incomplete 09/23/2023   Vaginal atrophy 09/23/2023   Urge incontinence 09/23/2023   Newly diagnosed diabetes (HCC) 02/15/2021   Anxiety about health in pandemic 06/09/2019   Small intestinal bacterial overgrowth 08/25/2018    Uterine prolapse, 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   Menopausal symptoms 12/09/2011   Pelvic pressure in female 12/09/2011   Overweight 12/09/2011   Osteoporosis 12/09/2011   Other dysphagia 02/24/2011   Unspecified hemorrhoids with other complication 02/24/2011   IBS (irritable bowel syndrome) 02/24/2011   Lactose intolerance 02/24/2011    PCP: Barnetta Liberty, MD  REFERRING PROVIDER: Arma Lamp, MD   REFERRING DIAG: N39.41 (ICD-10-CM) - Urge incontinence   THERAPY DIAG:  Muscle weakness (generalized)  Other lack of coordination  Rationale for Evaluation and Treatment: Rehabilitation  ONSET DATE: 08/2023  SUBJECTIVE:  SUBJECTIVE STATEMENT: I have worked on using the Estradial in certain areas and go the vaginal moisturizer. I still get some bleeding after I take the pessary out.   PAIN:  Are you having pain? No  PRECAUTIONS: Other: osteoporosis  RED FLAGS: None   WEIGHT BEARING RESTRICTIONS: No  FALLS:  Has patient fallen in last 6 months? No  OCCUPATION: retired  ACTIVITY LEVEL : walking 2-3 times per week  PLOF: Independent  PATIENT GOALS: reduce the urgency and leakage  PERTINENT HISTORY:  Anal fissure; Diverticulosis; IBS; Osteoporosis;   URINATION: Pain with urination: No, sometimes some burning Fully empty bladder: Yes: most of the times but in the mornings has to sit for 1-2 minutes or will double void Stream: Strong and most of the time Urgency: Yes  Frequency: night wakes up 1 time; during the day every 2-3 hours, will go more often when she takes her water pill Leakage: Urge to void, Walking to the bathroom, Lifting, and vacuuming, stooping to get something, leak in the morning prior to getting up out of bed Pads: Yes: day 2-3 liners, night 1 liner; when goes out  will where a depends or when traveling  INTERCOURSE:not active   PREGNANCY: Vaginal deliveries 2 Tearing Yes: with first child   PROLAPSE: Wears a pessary  OBJECTIVE:  Note: Objective measures were completed at Evaluation unless otherwise noted.  DIAGNOSTIC FINDINGS:  none  PATIENT SURVEYS:  PFIQ-7: 50 UIQ-7: 52  COGNITION: Overall cognitive status: Within functional limits for tasks assessed     SENSATION: Light touch: Appears intact   FUNCTIONAL TESTS:  5 times sit to stand: 12 sec SLS: hold 8 seconds each leg with no trendelenburg; squat but loses her balance   POSTURE: rounded shoulders, forward head, and increased thoracic kyphosis   LUMBARAROM/PROM: lumbar ROM limited by 25%   LOWER EXTREMITY ZOX:[id number] hip ROM is full   LOWER EXTREMITY MMT:  MMT Right eval Left eval  Hip flexion 4/5 4/5  Hip extension 4/5 4/5  Hip abduction 3/5 3/5   (Blank rows = not tested) PALPATION:               External Perineal Exam: the posterior fourchette is very red. Used a Ship broker for patient to see the area                             Internal Pelvic Floor: tightness along the introitus and perineal body. Some tenderness with palpation to the left levator ani.   Patient confirms identification and approves PT to assess internal pelvic floor and treatment Yes  PELVIC MMT:   MMT eval 01/05/24  Vaginal 1/5 2/5 with small hug of therapist finger  (Blank rows = not tested)        TONE: Average tone of the pelvic floor  PROLAPSE: Anterior wall weakness in supine when bulge  TODAY'S TREATMENT:  01/05/24 Manual: Internal pelvic floor techniques: No emotional/communication barriers or cognitive limitation. Patient is motivated to learn. Patient understands and agrees with treatment goals and plan. PT explains patient will be examined in standing, sitting, and lying down to see how their muscles and joints work. When they are ready, they will be asked to remove  their underwear so PT can examine their perineum. The patient is also given the option of providing their own chaperone as one is not provided in our facility. The patient also has the right and is explained the right to  defer or refuse any part of the evaluation or treatment including the internal exam. With the patient's consent, PT will use one gloved finger to gently assess the muscles of the pelvic floor, seeing how well it contracts and relaxes and if there is muscle symmetry. After, the patient will get dressed and PT and patient will discuss exam findings and plan of care. PT and patient discuss plan of care, schedule, attendance policy and HEP activities.  Working externally on the urogenital diaphragm to lengthen the tissue Using gloved finger going through the vaginal canal working on the sides of the introitus to lengthen the muscles and improve pelvic floor contraction  Neuromuscular re-education: Pelvic floor contraction training: Using gloved finger in the vaginal canal working on tapping and quick stretch to the pelvic floor muscles to improve contraction Balance:  Single leg stance holding 10 sec each leg 3 times each Tandem stance holding 30 sec 3 times each way.  Walking sideways with red band around thighs 8 feet 4 times Plank lift opposite arm and leg with tactile cues to engage the core 20 x                                                                                                                              DATE: 12/29/23  EVAL Examination completed, findings reviewed, pt educated on POC, HEP, and female pelvic floor anatomy, reasoning with pelvic floor assessment internally with pt consent. Pt motivated to participate in PT and agreeable to attempt recommendations.     PATIENT EDUCATION:  01/05/24 Education details: educated patient on vaginal moisturizers, how to apply, where to apply it, placing her estrogen cream in the posterior fourchette to reduce the redness;  [id number] Person educated: Patient Education method: Explanation, Demonstration, Tactile cues, Verbal cues, and Handouts Education comprehension: verbalized understanding, returned demonstration, verbal cues required, tactile cues required, and needs further education  HOME EXERCISE PROGRAM: 01/05/24 Access Code: [email or web address] URL: [email or web address] Date: 01/05/2024 Prepared by: Marsha Skeen  Exercises - Standing Single Leg Stance with Unilateral Counter Support  - 1 x daily - 7 x weekly - 1 sets - 6 reps - 10 sec hold - Tandem Stance with Support  - 1 x daily - 7 x weekly - 1 sets - 6 reps - 30 sec hold - Modified Bird Dog At Wall  - 1 x daily - 7 x weekly - 10 reps - Side Stepping with Resistance at Thighs  - 1 x daily - 7 x weekly - 1 sets - 4 reps - Seated Pelvic Floor Contraction  - 3 x daily - 7 x weekly - 1 sets - 10 reps - 3-5 sec hold    ASSESSMENT:  CLINICAL IMPRESSION: Patient is a 73 y.o. female who was seen today for physical therapy  treatment for urge incontinence. Pelvic floor strength increased to 2/5 with slight hug of therapist finger. She needed tactile cues to assist with pelvic floor contraction. She had  difficulty with plank lifting opposite extremity due to decreased in core strength. She is doing the program of moisturizing the vaginal area. Patient will benefit from skilled therapy to improve pelvic floor contraction and coordination while working on her balance to walk to the bathroom safely.   OBJECTIVE IMPAIRMENTS: decreased activity tolerance, decreased balance, decreased coordination, decreased strength, and increased fascial restrictions.   ACTIVITY LIMITATIONS: lifting, bending, squatting, continence, toileting, and locomotion level  PARTICIPATION LIMITATIONS: cleaning, laundry, shopping, and community activity  PERSONAL FACTORS: Age, Fitness, and 1-2 comorbidities: Anal fissure; Diverticulosis; IBS; Osteoporosis;  are also affecting patient's  functional outcome.   REHAB POTENTIAL: Excellent  CLINICAL DECISION MAKING: Evolving/moderate complexity  EVALUATION COMPLEXITY: Moderate   GOALS: Goals reviewed with patient? Yes  SHORT TERM GOALS: Target date: 01/26/24  Patient independent with initial HEP for pelvic floor strength and elongation.  Baseline: Goal status: INITIAL  2.  Patient educated on urge to urinate to reduce urinary urgency.  Baseline:  Goal status: INITIAL  3.  Patient educated on perineal massage to improve tissue mobility.  Baseline:  Goal status: INITIAL  4.  Patient educated patient on vaginal moisturizers to improve health of tissue.  Baseline:  Goal status: Met 01/05/24  LONG TERM GOALS: Target date: 03/22/24  Patient independent with core, hip and pelvic floor strength to continue with continence.  Baseline:  Goal status: INITIAL  2.  Patient is able to use the urge to void behavioral technique to walk to the bathroom slowly and not leak urine.  Baseline:  Goal status: INITIAL  3.  Patient is bend forward and lift an item with urinary leakage decreased >/= 80% with good body mechanics, pressure management and increased pelvic floor strength >/= 3/5.  Baseline:  Goal status: INITIAL  4.  Patient is able to squat without losing her balance to lift items off the floor and without leaking urine using pressure management with pelvic floor strength >/= 3/5.  Baseline:  Goal status: INITIAL  5.  Patient is able to vacuum with no urinary leakage due to pressure management and increased in pelvic floor strength with increased in hip strength.  Baseline:  Goal status: INITIAL   PLAN:  PT FREQUENCY: 1-2x/week  PT DURATION: 12 weeks  PLANNED INTERVENTIONS: 97110-Therapeutic exercises, 97530- Therapeutic activity, 97112- Neuromuscular re-education, 97535- Self Care, 47829- Manual therapy, Patient/Family education, Balance training, Dry Needling, Cryotherapy, Moist heat, and Biofeedback  PLAN  FOR NEXT SESSION: manual work to the pelvic floor, diaphragmatic breathing, balance exercises, urge to void   Marsha Skeen, PT 01/05/24 10:31 AM

## 2024-01-12 ENCOUNTER — Encounter: Payer: Medicare Other | Attending: Obstetrics and Gynecology | Admitting: Physical Therapy

## 2024-01-12 ENCOUNTER — Encounter: Payer: Self-pay | Admitting: Physical Therapy

## 2024-01-12 DIAGNOSIS — M6281 Muscle weakness (generalized): Secondary | ICD-10-CM | POA: Diagnosis present

## 2024-01-12 DIAGNOSIS — R278 Other lack of coordination: Secondary | ICD-10-CM | POA: Diagnosis present

## 2024-01-12 NOTE — Therapy (Signed)
 OUTPATIENT PHYSICAL THERAPY FEMALE PELVIC TREATMENT   Patient Name: Deborah Conner MRN: [id number] DOB:[birth year], 73 y.o., female Today's Date: 01/12/2024  END OF SESSION:  PT End of Session - 01/12/24 0939     Visit Number 3    Date for PT Re-Evaluation 03/22/24    Authorization Type medicare, aetna, tricare    Authorization - Visit Number 3    Authorization - Number of Visits 10    PT Start Time 0935    PT Stop Time 1020    PT Time Calculation (min) 45 min    Activity Tolerance Patient tolerated treatment well    Behavior During Therapy [facility] for tasks assessed/performed             Past Medical History:  Diagnosis Date   Anal fissure    Anxiety and depression 1998/1973   some panic   Aortic sclerosis 2005   Diverticulosis 2003   GERD (gastroesophageal reflux disease) 2001   Gestational diabetes    Glucose intolerance (impaired glucose tolerance) 2000   Heart murmur    HLD (hyperlipidemia)    IBS (irritable bowel syndrome) 1998   Insomnia 2003   Internal hemorrhoids    Lactose intolerance 1998   Migraine headache childhood   Osteoporosis 2010   Polyarthralgia    Rosacea 1975   Seborrheic dermatitis age 56   Small intestinal bacterial overgrowth 08/25/2018   Tinnitus 1999   Uterine prolapse, 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   Past Surgical History:  Procedure Laterality Date   CATARACT EXTRACTION Right 10/2021   with lens implant, had to have 2 surgeries b/c part of cataract broke off   COLONOSCOPY  2003, 04/11/2011   20012: diminutive polyp, diverticulosis, internal hemorrhoids   UPPER GASTROINTESTINAL ENDOSCOPY  01/30/2006, 04/11/2011   2007 and 2012:Normal    Patient Active Problem List   Diagnosis Date Noted   Uterovaginal prolapse, incomplete 09/23/2023   Vaginal atrophy 09/23/2023   Urge incontinence 09/23/2023   Newly diagnosed diabetes (HCC) 02/15/2021   Anxiety about health in pandemic 06/09/2019   Small intestinal bacterial overgrowth 08/25/2018    Uterine prolapse, 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   Menopausal symptoms 12/09/2011   Pelvic pressure in female 12/09/2011   Overweight 12/09/2011   Osteoporosis 12/09/2011   Other dysphagia 02/24/2011   Unspecified hemorrhoids with other complication 02/24/2011   IBS (irritable bowel syndrome) 02/24/2011   Lactose intolerance 02/24/2011    PCP: Barnetta Liberty, MD  REFERRING PROVIDER: Arma Lamp, MD   REFERRING DIAG: N39.41 (ICD-10-CM) - Urge incontinence   THERAPY DIAG:  Muscle weakness (generalized)  Other lack of coordination  Rationale for Evaluation and Treatment: Rehabilitation  ONSET DATE: 08/2023  SUBJECTIVE:  SUBJECTIVE STATEMENT: This past week I have not had urinary leakage while walking to the bathroom first thing in the morning.     PAIN:  Are you having pain? No  PRECAUTIONS: Other: osteoporosis  RED FLAGS: None   WEIGHT BEARING RESTRICTIONS: No  FALLS:  Has patient fallen in last 6 months? No  OCCUPATION: retired  ACTIVITY LEVEL : walking 2-3 times per week  PLOF: Independent  PATIENT GOALS: reduce the urgency and leakage  PERTINENT HISTORY:  Anal fissure; Diverticulosis; IBS; Osteoporosis;   URINATION: Pain with urination: No, sometimes some burning Fully empty bladder: Yes: most of the times but in the mornings has to sit for 1-2 minutes or will double void Stream: Strong and most of the time Urgency: Yes  Frequency: night wakes up 1 time; during the day every 2-3 hours, will go more often when she takes her water pill Leakage: Urge to void, Walking to the bathroom, Lifting, and vacuuming, stooping to get something, leak in the morning prior to getting up out of bed Pads: Yes: day 2-3 liners, night 1 liner; when goes out will where a depends or when  traveling  INTERCOURSE:not active   PREGNANCY: Vaginal deliveries 2 Tearing Yes: with first child   PROLAPSE: Wears a pessary  OBJECTIVE:  Note: Objective measures were completed at Evaluation unless otherwise noted.  DIAGNOSTIC FINDINGS:  none  PATIENT SURVEYS:  PFIQ-7: 50 UIQ-7: 52  COGNITION: Overall cognitive status: Within functional limits for tasks assessed     SENSATION: Light touch: Appears intact   FUNCTIONAL TESTS:  5 times sit to stand: 12 sec SLS: hold 8 seconds each leg with no trendelenburg; squat but loses her balance   POSTURE: rounded shoulders, forward head, and increased thoracic kyphosis   LUMBARAROM/PROM: lumbar ROM limited by 25%   LOWER EXTREMITY [id number]:[id number] hip ROM is full   LOWER EXTREMITY MMT:  MMT Right eval Left eval  Hip flexion 4/5 4/5  Hip extension 4/5 4/5  Hip abduction 3/5 3/5   (Blank rows = not tested) PALPATION:               External Perineal Exam: the posterior fourchette is very red. Used a Ship broker for patient to see the area                             Internal Pelvic Floor: tightness along the introitus and perineal body. Some tenderness with palpation to the left levator ani.   Patient confirms identification and approves PT to assess internal pelvic floor and treatment Yes  PELVIC MMT:   MMT eval 01/05/24  Vaginal 1/5 2/5 with small hug of therapist finger  (Blank rows = not tested)        TONE: Average tone of the pelvic floor  PROLAPSE: Anterior wall weakness in supine when bulge  TODAY'S TREATMENT:  01/12/24 Neuromuscular re-education: Core retraining: Transverse abdominus contraction 10 x  Supine dead bug  with verbal cues to engage her abdomen and breath out 10 x each way Supine with ball squeeze and pelvic floor contraction bringing knees side to side to engage the obliques 15 x each side Balance:  Single leg stance 5 x each leg holding 10 sec Tandem stance 3 times each way holding 30  sec Walking sideways with red band around thighs 8 feet 4 times Exercises: Strengthening: Plank lift opposite arm and leg with tactile cues to engage the core 20 x Bridge with  ball squeeze 15 x with verbal cues to raise her hips Standing bilateral shoulder retraction with tactile cues on form 10 x  Self-care: Educated patient on how to place the daily moisturizer around the vulva area, urethra, and perineal body. She understands and used a model to show her    01/05/24 Manual: Internal pelvic floor techniques: No emotional/communication barriers or cognitive limitation. Patient is motivated to learn. Patient understands and agrees with treatment goals and plan. PT explains patient will be examined in standing, sitting, and lying down to see how their muscles and joints work. When they are ready, they will be asked to remove their underwear so PT can examine their perineum. The patient is also given the option of providing their own chaperone as one is not provided in our facility. The patient also has the right and is explained the right to defer or refuse any part of the evaluation or treatment including the internal exam. With the patient's consent, PT will use one gloved finger to gently assess the muscles of the pelvic floor, seeing how well it contracts and relaxes and if there is muscle symmetry. After, the patient will get dressed and PT and patient will discuss exam findings and plan of care. PT and patient discuss plan of care, schedule, attendance policy and HEP activities.  Working externally on the urogenital diaphragm to lengthen the tissue Using gloved finger going through the vaginal canal working on the sides of the introitus to lengthen the muscles and improve pelvic floor contraction  Neuromuscular re-education: Pelvic floor contraction training: Using gloved finger in the vaginal canal working on tapping and quick stretch to the pelvic floor muscles to improve  contraction Balance:  Single leg stance holding 10 sec each leg 3 times each Tandem stance holding 30 sec 3 times each way.  Walking sideways with red band around thighs 8 feet 4 times Plank lift opposite arm and leg with tactile cues to engage the core 20 x                                                                                                                              DATE: 12/29/23  EVAL Examination completed, findings reviewed, pt educated on POC, HEP, and female pelvic floor anatomy, reasoning with pelvic floor assessment internally with pt consent. Pt motivated to participate in PT and agreeable to attempt recommendations.     PATIENT EDUCATION:  01/12/24 Education details:  [id number] Person educated: Patient Education method: Explanation, Demonstration, Tactile cues, Verbal cues, and Handouts Education comprehension: verbalized understanding, returned demonstration, verbal cues required, tactile cues required, and needs further education  HOME EXERCISE PROGRAM: 01/12/24 Access Code: [email or web address] URL: [email or web address] Date: 01/12/2024 Prepared by: Marsha Skeen  Exercises - Standing Single Leg Stance with Unilateral Counter Support  - 1 x daily - 7 x weekly - 1 sets - 6 reps - 10 sec hold - Tandem Stance with Support  - 1 x daily -  7 x weekly - 1 sets - 6 reps - 30 sec hold - Modified Bird Dog At Wall  - 1 x daily - 7 x weekly - 10 reps - Side Stepping with Resistance at Thighs  - 1 x daily - 7 x weekly - 1 sets - 4 reps - Seated Pelvic Floor Contraction  - 3 x daily - 7 x weekly - 1 sets - 10 reps - 3-5 sec hold - Dead Bug  - 1 x daily - 3 x weekly - 1 sets - 10 reps - Supine Bridge with Mini Swiss Ball Between Knees  - 1 x daily - 3 x weekly - 1 sets - 15 reps - Supine Lower Trunk Rotation  - 1 x daily - 3 x weekly - 1 sets - 15 reps    ASSESSMENT:  CLINICAL IMPRESSION: Patient is a 73 y.o. female who was seen today for physical therapy  treatment for  urge incontinence. This past week patient has not had urinary leakage while walking to the bathroom first thing in the morning. Patient has not had urinary leakage since last visit. She is able to hold her urine better. She needs more core strength and back strength for pelvic floor strength. She understands how to place the vaginal moisturizer to the vulva rea and purchased her moisturizer. Patient will benefit from skilled therapy to improve pelvic floor contraction and coordination while working on her balance to walk to the bathroom safely.   OBJECTIVE IMPAIRMENTS: decreased activity tolerance, decreased balance, decreased coordination, decreased strength, and increased fascial restrictions.   ACTIVITY LIMITATIONS: lifting, bending, squatting, continence, toileting, and locomotion level  PARTICIPATION LIMITATIONS: cleaning, laundry, shopping, and community activity  PERSONAL FACTORS: Age, Fitness, and 1-2 comorbidities: Anal fissure; Diverticulosis; IBS; Osteoporosis;  are also affecting patient's functional outcome.   REHAB POTENTIAL: Excellent  CLINICAL DECISION MAKING: Evolving/moderate complexity  EVALUATION COMPLEXITY: Moderate   GOALS: Goals reviewed with patient? Yes  SHORT TERM GOALS: Target date: 01/26/24  Patient independent with initial HEP for pelvic floor strength and elongation.  Baseline: Goal status: Met 01/12/24  2.  Patient educated on urge to urinate to reduce urinary urgency.  Baseline:  Goal status: INITIAL  3.  Patient educated on perineal massage to improve tissue mobility.  Baseline:  Goal status: Met 01/12/24 4.  Patient educated patient on vaginal moisturizers to improve health of tissue.  Baseline:  Goal status: Met 01/05/24  LONG TERM GOALS: Target date: 03/22/24  Patient independent with core, hip and pelvic floor strength to continue with continence.  Baseline:  Goal status: INITIAL  2.  Patient is able to use the urge to void behavioral technique  to walk to the bathroom slowly and not leak urine.  Baseline:  Goal status: INITIAL  3.  Patient is bend forward and lift an item with urinary leakage decreased >/= 80% with good body mechanics, pressure management and increased pelvic floor strength >/= 3/5.  Baseline:  Goal status: INITIAL  4.  Patient is able to squat without losing her balance to lift items off the floor and without leaking urine using pressure management with pelvic floor strength >/= 3/5.  Baseline:  Goal status: INITIAL  5.  Patient is able to vacuum with no urinary leakage due to pressure management and increased in pelvic floor strength with increased in hip strength.  Baseline:  Goal status: INITIAL   PLAN:  PT FREQUENCY: 1-2x/week  PT DURATION: 12 weeks  PLANNED INTERVENTIONS: 97110-Therapeutic exercises, 97530- Therapeutic  activity, [id number]- Neuromuscular re-education, [phone]- Self Care, [id number]- Manual therapy, Patient/Family education, Balance training, Dry Needling, Cryotherapy, Moist heat, and Biofeedback  PLAN FOR NEXT SESSION:  balance exercises, urge to void, standing core strength   Marsha Skeen, PT 01/12/24 10:32 AM

## 2024-01-19 ENCOUNTER — Encounter: Payer: Medicare Other | Admitting: Physical Therapy

## 2024-01-28 ENCOUNTER — Encounter: Payer: Self-pay | Admitting: Physical Therapy

## 2024-01-28 DIAGNOSIS — M6281 Muscle weakness (generalized): Secondary | ICD-10-CM | POA: Diagnosis not present

## 2024-01-28 DIAGNOSIS — R278 Other lack of coordination: Secondary | ICD-10-CM

## 2024-01-28 NOTE — Therapy (Signed)
 OUTPATIENT PHYSICAL THERAPY FEMALE PELVIC TREATMENT   Patient Name: Deborah Conner MRN: [id number] DOB:28-Sep-1950, 73 y.o., female Today's Date: 01/28/2024  END OF SESSION:  PT End of Session - 01/28/24 1135     Visit Number 4    Date for PT Re-Evaluation 03/22/24    Authorization Type medicare, aetna, tricare    Authorization - Visit Number 4    Authorization - Number of Visits 10    PT Start Time 1130    PT Stop Time 1215    PT Time Calculation (min) 45 min    Activity Tolerance Patient tolerated treatment well    Behavior During Therapy [facility] for tasks assessed/performed          Past Medical History:  Diagnosis Date   Anal fissure    Anxiety and depression 1998/1973   some panic   Aortic sclerosis 2005   Diverticulosis 2003   GERD (gastroesophageal reflux disease) 2001   Gestational diabetes    Glucose intolerance (impaired glucose tolerance) 2000   Heart murmur    HLD (hyperlipidemia)    IBS (irritable bowel syndrome) 1998   Insomnia 2003   Internal hemorrhoids    Lactose intolerance 1998   Migraine headache childhood   Osteoporosis 2010   Polyarthralgia    Rosacea 1975   Seborrheic dermatitis age 68   Small intestinal bacterial overgrowth 08/25/2018   Tinnitus 1999   Uterine prolapse, 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   Past Surgical History:  Procedure Laterality Date   CATARACT EXTRACTION Right 10/2021   with lens implant, had to have 2 surgeries b/c part of cataract broke off   COLONOSCOPY  2003, 04/11/2011   20012: diminutive polyp, diverticulosis, internal hemorrhoids   UPPER GASTROINTESTINAL ENDOSCOPY  01/30/2006, 04/11/2011   2007 and 2012:Normal    Patient Active Problem List   Diagnosis Date Noted   Uterovaginal prolapse, incomplete 09/23/2023   Vaginal atrophy 09/23/2023   Urge incontinence 09/23/2023   Newly diagnosed diabetes (HCC) 02/15/2021   Anxiety about health in pandemic 06/09/2019   Small intestinal bacterial overgrowth 08/25/2018    Uterine prolapse, 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   Menopausal symptoms 12/09/2011   Pelvic pressure in female 12/09/2011   Overweight 12/09/2011   Osteoporosis 12/09/2011   Other dysphagia 02/24/2011   Unspecified hemorrhoids with other complication 02/24/2011   IBS (irritable bowel syndrome) 02/24/2011   Lactose intolerance 02/24/2011    PCP: Barnetta Liberty, MD  REFERRING PROVIDER: Arma Lamp, MD   REFERRING DIAG: N39.41 (ICD-10-CM) - Urge incontinence   THERAPY DIAG:  Muscle weakness (generalized)  Other lack of coordination  Rationale for Evaluation and Treatment: Rehabilitation  ONSET DATE: 08/2023  SUBJECTIVE:  SUBJECTIVE STATEMENT: Felt different when taking out the pessary. Urinary leakage. 1 time since last visit.   PAIN:  Are you having pain? No  PRECAUTIONS: Other: osteoporosis  RED FLAGS: None   WEIGHT BEARING RESTRICTIONS: No  FALLS:  Has patient fallen in last 6 months? No  OCCUPATION: retired  ACTIVITY LEVEL : walking 2-3 times per week  PLOF: Independent  PATIENT GOALS: reduce the urgency and leakage  PERTINENT HISTORY:  Anal fissure; Diverticulosis; IBS; Osteoporosis;   URINATION: Pain with urination: No, sometimes some burning 01/28/24; no burning with regular urination Fully empty bladder: Yes: most of the times but in the mornings has to sit for 1-2 minutes or will double void Stream: Strong and most of the time Urgency: Yes  01/28/24: urgency is 75% better Frequency: night wakes up 1 time; during the day every 2-3 hours, will go more often when she takes her water pill Leakage: Urge to void, Walking to the bathroom, Lifting, and vacuuming, stooping to get something, leak in the morning prior to getting up out of bed Pads: Yes: day 2-3 liners, night 1  liner; when goes out will where a depends or when traveling 01/28/24: patient will change the liners due to more of the vaginal discharge than urinary leakage.   INTERCOURSE:not active   PREGNANCY: Vaginal deliveries 2 Tearing Yes: with first child   PROLAPSE: Wears a pessary  OBJECTIVE:  Note: Objective measures were completed at Evaluation unless otherwise noted.  DIAGNOSTIC FINDINGS:  none  PATIENT SURVEYS:  PFIQ-7: 50 UIQ-7: 52  COGNITION: Overall cognitive status: Within functional limits for tasks assessed     SENSATION: Light touch: Appears intact   FUNCTIONAL TESTS:  5 times sit to stand: 12 sec SLS: hold 8 seconds each leg with no trendelenburg; squat but loses her balance   POSTURE: rounded shoulders, forward head, and increased thoracic kyphosis   LUMBARAROM/PROM: lumbar ROM limited by 25%   LOWER EXTREMITY ZOX:[id number] hip ROM is full   LOWER EXTREMITY MMT:  MMT Right eval Left eval  Hip flexion 4/5 4/5  Hip extension 4/5 4/5  Hip abduction 3/5 3/5   (Blank rows = not tested) PALPATION:               External Perineal Exam: the posterior fourchette is very red. Used a Ship broker for patient to see the area                             Internal Pelvic Floor: tightness along the introitus and perineal body. Some tenderness with palpation to the left levator ani.   Patient confirms identification and approves PT to assess internal pelvic floor and treatment Yes  PELVIC MMT:   MMT eval 01/05/24  Vaginal 1/5 2/5 with small hug of therapist finger  (Blank rows = not tested)        TONE: Average tone of the pelvic floor  PROLAPSE: Anterior wall weakness in supine when bulge  TODAY'S TREATMENT:  01/28/24 Exercises: Strengthening: Plank lift opposite arm and leg with tactile cues to engage the core 20 x in standing with cues to engage the core and gluteals Walking sideways with red band around thighs 8 feet 4 times Standing with red band  around the hips, moving the leg from front to side then back 15 x each leg x 2 Supine hip flexion with ball between knees 15 x each leg with core engaged Bilateral shoulder retraction with red  band 3 x 10 with core engaged  01/12/24 Neuromuscular re-education: Core retraining: Transverse abdominus contraction 10 x  Supine dead bug  with verbal cues to engage her abdomen and breath out 10 x each way Supine with ball squeeze and pelvic floor contraction bringing knees side to side to engage the obliques 15 x each side Balance:  Single leg stance 5 x each leg holding 10 sec Tandem stance 3 times each way holding 30 sec Walking sideways with red band around thighs 8 feet 4 times Exercises: Strengthening: Plank lift opposite arm and leg with tactile cues to engage the core 20 x Bridge with ball squeeze 15 x with verbal cues to raise her hips Standing bilateral shoulder retraction with tactile cues on form 10 x  Self-care: Educated patient on how to place the daily moisturizer around the vulva area, urethra, and perineal body. She understands and used a model to show her    01/05/24 Manual: Internal pelvic floor techniques: No emotional/communication barriers or cognitive limitation. Patient is motivated to learn. Patient understands and agrees with treatment goals and plan. PT explains patient will be examined in standing, sitting, and lying down to see how their muscles and joints work. When they are ready, they will be asked to remove their underwear so PT can examine their perineum. The patient is also given the option of providing their own chaperone as one is not provided in our facility. The patient also has the right and is explained the right to defer or refuse any part of the evaluation or treatment including the internal exam. With the patient's consent, PT will use one gloved finger to gently assess the muscles of the pelvic floor, seeing how well it contracts and relaxes and if there is  muscle symmetry. After, the patient will get dressed and PT and patient will discuss exam findings and plan of care. PT and patient discuss plan of care, schedule, attendance policy and HEP activities.  Working externally on the urogenital diaphragm to lengthen the tissue Using gloved finger going through the vaginal canal working on the sides of the introitus to lengthen the muscles and improve pelvic floor contraction  Neuromuscular re-education: Pelvic floor contraction training: Using gloved finger in the vaginal canal working on tapping and quick stretch to the pelvic floor muscles to improve contraction Balance:  Single leg stance holding 10 sec each leg 3 times each Tandem stance holding 30 sec 3 times each way.  Walking sideways with red band around thighs 8 feet 4 times Plank lift opposite arm and leg with tactile cues to engage the core 20 x                                                                                                                              DATE: 12/29/23  EVAL Examination completed, findings reviewed, pt educated on POC, HEP, and female pelvic floor anatomy, reasoning with pelvic floor assessment internally with pt consent.  Pt motivated to participate in PT and agreeable to attempt recommendations.     PATIENT EDUCATION:  01/28/24 Education details:  [email or web address] Person educated: Patient Education method: Explanation, Demonstration, Tactile cues, Verbal cues, and Handouts Education comprehension: verbalized understanding, returned demonstration, verbal cues required, tactile cues required, and needs further education  HOME EXERCISE PROGRAM: 01/28/24 Access Code: [email or web address] URL: [email or web address] Date: 01/28/2024 Prepared by: Marsha Skeen  Exercises - Standing Single Leg Stance with Unilateral Counter Support  - 1 x daily - 7 x weekly - 1 sets - 6 reps - 10 sec hold - Tandem Stance with Support  - 1 x daily - 7 x weekly - 1 sets - 6 reps - 30  sec hold - Modified Bird Dog At Wall  - 1 x daily - 7 x weekly - 10 reps - Side Stepping with Resistance at Thighs  - 1 x daily - 7 x weekly - 1 sets - 4 reps - Seated Pelvic Floor Contraction  - 3 x daily - 7 x weekly - 1 sets - 10 reps - 3-5 sec hold - Dead Bug  - 1 x daily - 3 x weekly - 1 sets - 10 reps - Supine Bridge with Mini Swiss Ball Between Knees  - 1 x daily - 3 x weekly - 1 sets - 15 reps - Supine Lower Trunk Rotation  - 1 x daily - 3 x weekly - 1 sets - 15 reps - Standing 3-Way Leg Reach with Resistance at Ankles and Counter Support  - 1 x daily - 3 x weekly - 2 sets - 10 reps - Scapular Retraction with Resistance  - 1 x daily - 3 x weekly - 2 sets - 10 reps    ASSESSMENT:  CLINICAL IMPRESSION: Patient is a 73 y.o. female who was seen today for physical therapy  treatment for urge incontinence. This past week patient has not had urinary leakage while walking to the bathroom first thing in the morning. She only leaked urine 1 time since last visit.Aaron Aas Her urinary urgency has improved by 75%. No burning with urination. She does not want her pelvic floor assessed at this time.  Patient will benefit from skilled therapy to improve pelvic floor contraction and coordination while working on her balance to walk to the bathroom safely.   OBJECTIVE IMPAIRMENTS: decreased activity tolerance, decreased balance, decreased coordination, decreased strength, and increased fascial restrictions.   ACTIVITY LIMITATIONS: lifting, bending, squatting, continence, toileting, and locomotion level  PARTICIPATION LIMITATIONS: cleaning, laundry, shopping, and community activity  PERSONAL FACTORS: Age, Fitness, and 1-2 comorbidities: Anal fissure; Diverticulosis; IBS; Osteoporosis;  are also affecting patient's functional outcome.   REHAB POTENTIAL: Excellent  CLINICAL DECISION MAKING: Evolving/moderate complexity  EVALUATION COMPLEXITY: Moderate   GOALS: Goals reviewed with patient? Yes  SHORT  TERM GOALS: Target date: 01/26/24  Patient independent with initial HEP for pelvic floor strength and elongation.  Baseline: Goal status: Met 01/12/24  2.  Patient educated on urge to urinate to reduce urinary urgency.  Baseline:  Goal status: Met 01/28/24  3.  Patient educated on perineal massage to improve tissue mobility.  Baseline:  Goal status: Met 01/12/24 4.  Patient educated patient on vaginal moisturizers to improve health of tissue.  Baseline:  Goal status: Met 01/05/24  LONG TERM GOALS: Target date: 03/22/24  Patient independent with core, hip and pelvic floor strength to continue with continence.  Baseline:  Goal status: INITIAL  2.  Patient is able to use the urge to  void behavioral technique to walk to the bathroom slowly and not leak urine.  Baseline:  Goal status: Met 01/28/24  3.  Patient is bend forward and lift an item with urinary leakage decreased >/= 80% with good body mechanics, pressure management and increased pelvic floor strength >/= 3/5.  Baseline:  Goal status: INITIAL  4.  Patient is able to squat without losing her balance to lift items off the floor and without leaking urine using pressure management with pelvic floor strength >/= 3/5.  Baseline:  Goal status: INITIAL  5.  Patient is able to vacuum with no urinary leakage due to pressure management and increased in pelvic floor strength with increased in hip strength.  Baseline:  Goal status: INITIAL   PLAN:  PT FREQUENCY: 1-2x/week  PT DURATION: 12 weeks  PLANNED INTERVENTIONS: 97110-Therapeutic exercises, 97530- Therapeutic activity, 97112- Neuromuscular re-education, 97535- Self Care, 13244- Manual therapy, Patient/Family education, Balance training, Dry Needling, Cryotherapy, Moist heat, and Biofeedback  PLAN FOR NEXT SESSION:  balance exercises,  standing core strength, if doing well then discharge   Marsha Skeen, PT 01/28/24 12:27 PM

## 2024-02-01 ENCOUNTER — Encounter: Payer: Self-pay | Admitting: Podiatry

## 2024-02-01 ENCOUNTER — Ambulatory Visit (INDEPENDENT_AMBULATORY_CARE_PROVIDER_SITE_OTHER): Payer: Medicare Other | Admitting: Podiatry

## 2024-02-01 DIAGNOSIS — B351 Tinea unguium: Secondary | ICD-10-CM

## 2024-02-01 DIAGNOSIS — D849 Immunodeficiency, unspecified: Secondary | ICD-10-CM | POA: Insufficient documentation

## 2024-02-01 DIAGNOSIS — M79675 Pain in left toe(s): Secondary | ICD-10-CM | POA: Diagnosis not present

## 2024-02-01 DIAGNOSIS — R3915 Urgency of urination: Secondary | ICD-10-CM | POA: Insufficient documentation

## 2024-02-01 DIAGNOSIS — E1151 Type 2 diabetes mellitus with diabetic peripheral angiopathy without gangrene: Secondary | ICD-10-CM

## 2024-02-01 DIAGNOSIS — M79674 Pain in right toe(s): Secondary | ICD-10-CM

## 2024-02-01 DIAGNOSIS — M2550 Pain in unspecified joint: Secondary | ICD-10-CM | POA: Insufficient documentation

## 2024-02-01 DIAGNOSIS — H15049 Scleritis with corneal involvement, unspecified eye: Secondary | ICD-10-CM | POA: Insufficient documentation

## 2024-02-01 NOTE — Progress Notes (Signed)
  Subjective:  Patient ID: Deborah Conner, female    DOB: August 29, 1950,   MRN: [id number]  Chief Complaint  Patient presents with   Debridement    Trim toenails/calluses - diabetic - 6.0    73 y.o. female presents for routine foot care in a high risk foot. . Relates continued tenderness to left third digit.  Her blood sugars have been high and relates A1c was up to 14. Most recent in January was 6.3.Requesting nail trim today . Denies any other pedal complaints. Denies n/v/f/c.   PCP: Glendia Freeman MD   Past Medical History:  Diagnosis Date   Anal fissure    Anxiety and depression 1998/1973   some panic   Aortic sclerosis 2005   Diverticulosis 2003   GERD (gastroesophageal reflux disease) 2001   Gestational diabetes    Glucose intolerance (impaired glucose tolerance) 2000   Heart murmur    HLD (hyperlipidemia)    IBS (irritable bowel syndrome) 1998   Insomnia 2003   Internal hemorrhoids    Lactose intolerance 1998   Migraine headache childhood   Osteoporosis 2010   Polyarthralgia    Rosacea 1975   Seborrheic dermatitis age 6   Small intestinal bacterial overgrowth 08/25/2018   Tinnitus 1999   Uterine prolapse, 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 CFT <3 seconds. Normal hair growth on digits. No edema.  Skin. No lacerations or abrasions bilateral feet. Nails 1-5 are thickened discolored and elongated with subungual debris.  Hyperkearotic tissue noted to lateral nail border of third digit with tenderness.  Musculoskeletal: MMT 5/5 bilateral lower extremities in DF, PF, Inversion and Eversion. Deceased ROM in DF of ankle joint.  Neurological: Sensation intact to light touch.   Assessment:    1. Pain due to onychomycosis of toenails of both feet   2. Type II diabetes mellitus with peripheral circulatory disorder (HCC)       Plan:  Patient was evaluated and treated and all questions answered. -Discussed and  educated patient on diabetic foot care, especially with  regards to the vascular, neurological and musculoskeletal systems.  -Stressed the importance of good glycemic control and the detriment of not  controlling glucose levels in relation to the foot. -Discussed supportive shoes at all times and checking feet regularly.  -Mechanically debrided all nails 1-5 bilateral using sterile nail nipper and filed with dremel without incident  -Answered all patient questions -Patient to return  in 3 months for at risk foot care -Patient advised to call the office if any problems or questions arise in the meantime.   Asberry Failing, DPM

## 2024-02-08 ENCOUNTER — Other Ambulatory Visit: Payer: Self-pay | Admitting: Family Medicine

## 2024-02-08 DIAGNOSIS — Z1231 Encounter for screening mammogram for malignant neoplasm of breast: Secondary | ICD-10-CM

## 2024-03-01 ENCOUNTER — Encounter: Payer: Self-pay | Admitting: Physical Therapy

## 2024-03-01 ENCOUNTER — Encounter: Attending: Obstetrics and Gynecology | Admitting: Physical Therapy

## 2024-03-01 DIAGNOSIS — R278 Other lack of coordination: Secondary | ICD-10-CM | POA: Diagnosis present

## 2024-03-01 DIAGNOSIS — M6281 Muscle weakness (generalized): Secondary | ICD-10-CM | POA: Diagnosis present

## 2024-03-01 NOTE — Therapy (Signed)
 OUTPATIENT PHYSICAL THERAPY FEMALE PELVIC TREATMENT   Patient Name: Deborah Conner MRN: [id number] DOB:Jan 07, 1951, 73 y.o., female Today's Date: 03/01/2024  END OF SESSION:  PT End of Session - 03/01/24 1041     Visit Number 5    Date for PT Re-Evaluation 03/22/24    Authorization Type medicare, aetna, tricare    Authorization - Visit Number 5    Authorization - Number of Visits 10    PT Start Time 1038    PT Stop Time 1125    PT Time Calculation (min) 47 min    Activity Tolerance Patient tolerated treatment well    Behavior During Therapy WFL for tasks assessed/performed          Past Medical History:  Diagnosis Date   Anal fissure    Anxiety and depression 1998/1973   some panic   Aortic sclerosis 2005   Diverticulosis 2003   GERD (gastroesophageal reflux disease) 2001   Gestational diabetes    Glucose intolerance (impaired glucose tolerance) 2000   Heart murmur    HLD (hyperlipidemia)    IBS (irritable bowel syndrome) 1998   Insomnia 2003   Internal hemorrhoids    Lactose intolerance 1998   Migraine headache childhood   Osteoporosis 2010   Polyarthralgia    Rosacea 1975   Seborrheic dermatitis age 18   Small intestinal bacterial overgrowth 08/25/2018   Tinnitus 1999   Uterine prolapse, 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   Past Surgical History:  Procedure Laterality Date   CATARACT EXTRACTION Right 10/2021   with lens implant, had to have 2 surgeries b/c part of cataract broke off   COLONOSCOPY  2003, 04/11/2011   20012: diminutive polyp, diverticulosis, internal hemorrhoids   UPPER GASTROINTESTINAL ENDOSCOPY  01/30/2006, 04/11/2011   2007 and 2012:Normal    Patient Active Problem List   Diagnosis Date Noted   Immunosuppressed status (HCC) 02/01/2024   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   Joint pain 02/01/2024   Urinary urgency 02/01/2024   Uterovaginal prolapse, incomplete 09/23/2023   Vaginal atrophy 09/23/2023   Urge incontinence 09/23/2023    Abnormal finding on evaluation procedure 11/19/2022   Hypercholesterolemia 06/11/2021   Immunodeficiency disorder (HCC) 06/11/2021   Giant cell arteritis (HCC) 03/27/2021   Newly diagnosed diabetes (HCC) 02/15/2021   Elevated erythrocyte sedimentation rate 12/14/2020   Left temporal headache 12/12/2020   Anxiety about health in pandemic 06/09/2019   Abdominal pain 05/27/2019   Benign neoplasm of vulva 05/27/2019   Dysuria 05/27/2019   Lesion of vulva 05/27/2019   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   Small intestinal bacterial overgrowth 08/25/2018   Hirsutism 04/15/2018   Eczema of both external ears 03/06/2016   Seborrheic dermatitis of scalp 07/26/2015   Uterine prolapse, 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   Menopausal symptoms 12/09/2011   Pelvic pressure in female 12/09/2011   Overweight 12/09/2011   Osteoporosis 12/09/2011   Allergic rhinitis 07/25/2011   Tinnitus 07/25/2011   Other dysphagia 02/24/2011   Unspecified hemorrhoids with other complication 02/24/2011   IBS (irritable bowel syndrome) 02/24/2011   Lactose intolerance 02/24/2011    PCP: Larnell Hamilton, MD  REFERRING PROVIDER: Marilynne Rosaline SAILOR, MD   REFERRING DIAG: N39.41 (ICD-10-CM) - Urge incontinence   THERAPY DIAG:  Muscle weakness (generalized)  Other lack of coordination  Rationale for Evaluation and Treatment: Rehabilitation  ONSET DATE: 08/2023  SUBJECTIVE:  SUBJECTIVE STATEMENT: I left the pessary in longer than I should one time. I can now walk to the bathroom and not leak urine. I am taking another fluid pill. I can sleep through the night.   PAIN:  Are you having pain? No  PRECAUTIONS: Other: osteoporosis  RED FLAGS: None   WEIGHT BEARING RESTRICTIONS: No  FALLS:  Has patient fallen in last 6 months?  No  OCCUPATION: retired  ACTIVITY LEVEL : walking 2-3 times per week  PLOF: Independent  PATIENT GOALS: reduce the urgency and leakage  PERTINENT HISTORY:  Anal fissure; Diverticulosis; IBS; Osteoporosis;   URINATION: Pain with urination: No, sometimes some burning 01/28/24; no burning with regular urination Fully empty bladder: Yes: most of the times but in the mornings has to sit for 1-2 minutes or will double void Stream: Strong and most of the time Urgency: Yes  01/28/24: urgency is 75% better Frequency: night wakes up 1 time; during the day every 2-3 hours, will go more often when she takes her water pill Leakage: Urge to void, Walking to the bathroom, Lifting, and vacuuming, stooping to get something, leak in the morning prior to getting up out of bed 03/01/24: Urge to void, leak in the morning prior to getting up out of bed and has happened 3 times since last session Pads: Yes: day 2-3 liners, night 1 liner; when goes out will where a depends or when traveling 01/28/24: patient will change the liners due to more of the vaginal discharge than urinary leakage.  03/01/24: wears a pad due to the discharge vaginally not urinary leakage  INTERCOURSE:not active   PREGNANCY: Vaginal deliveries 2 Tearing Yes: with first child   PROLAPSE: Wears a pessary  OBJECTIVE:  Note: Objective measures were completed at Evaluation unless otherwise noted.  DIAGNOSTIC FINDINGS:  none  PATIENT SURVEYS:  PFIQ-7: 50 UIQ-7: 52  COGNITION: Overall cognitive status: Within functional limits for tasks assessed     SENSATION: Light touch: Appears intact   FUNCTIONAL TESTS:  5 times sit to stand: 12 sec SLS: hold 8 seconds each leg with no trendelenburg; squat but loses her balance   POSTURE: rounded shoulders, forward head, and increased thoracic kyphosis   LUMBARAROM/PROM: lumbar ROM limited by 25%   LOWER EXTREMITY MNF:apojuzmjo hip ROM is full   LOWER EXTREMITY MMT:  MMT  Right eval Left eval  Hip flexion 4/5 4/5  Hip extension 4/5 4/5  Hip abduction 3/5 3/5   (Blank rows = not tested) PALPATION:               External Perineal Exam: the posterior fourchette is very red. Used a Ship broker for patient to see the area                             Internal Pelvic Floor: tightness along the introitus and perineal body. Some tenderness with palpation to the left levator ani.   Patient confirms identification and approves PT to assess internal pelvic floor and treatment Yes  PELVIC MMT:   MMT eval 01/05/24  Vaginal 1/5 2/5 with small hug of therapist finger  (Blank rows = not tested)        TONE: Average tone of the pelvic floor  PROLAPSE: Anterior wall weakness in supine when bulge  TODAY'S TREATMENT:  03/01/24 Exercises: Strengthening: Sit to stand 5 times: 13 sec, 12 sec, 12 sec Squat 10 times without leakage Bridge without ball 20 x with  control Dead bug with knees flexed 10 x Dead but with knees extended 10 x  Trunk rotation with ball between knees to use the obliques 15 x  Standing bilateral shoulder retraction with red band 20 x  Sitting pelvic floor contraction 10 x    01/28/24 Exercises: Strengthening: Plank lift opposite arm and leg with tactile cues to engage the core 20 x in standing with cues to engage the core and gluteals Walking sideways with red band around thighs 8 feet 4 times Standing with red band around the hips, moving the leg from front to side then back 15 x each leg x 2 Supine hip flexion with ball between knees 15 x each leg with core engaged Bilateral shoulder retraction with red band 3 x 10 with core engaged  01/12/24 Neuromuscular re-education: Core retraining: Transverse abdominus contraction 10 x  Supine dead bug  with verbal cues to engage her abdomen and breath out 10 x each way Supine with ball squeeze and pelvic floor contraction bringing knees side to side to engage the obliques 15 x each side Balance:   Single leg stance 5 x each leg holding 10 sec Tandem stance 3 times each way holding 30 sec Walking sideways with red band around thighs 8 feet 4 times Exercises: Strengthening: Plank lift opposite arm and leg with tactile cues to engage the core 20 x Bridge with ball squeeze 15 x with verbal cues to raise her hips Standing bilateral shoulder retraction with tactile cues on form 10 x  Self-care: Educated patient on how to place the daily moisturizer around the vulva area, urethra, and perineal body. She understands and used a model to show her    PATIENT EDUCATION:  03/01/24 Education details:  [email or web address] Person educated: Patient Education method: Explanation, Demonstration, Tactile cues, Verbal cues, and Handouts Education comprehension: verbalized understanding, returned demonstration, verbal cues required, tactile cues required, and needs further education  HOME EXERCISE PROGRAM: 03/01/24 Access Code: [email or web address] URL: [email or web address] Date: 03/01/2024 Prepared by: Channing Pereyra  Exercises - Standing Single Leg Stance with Unilateral Counter Support  - 1 x daily - 7 x weekly - 1 sets - 6 reps - 10 sec hold - Tandem Stance with Support  - 1 x daily - 7 x weekly - 1 sets - 6 reps - 30 sec hold - Modified Bird Dog At Wall  - 1 x daily - 7 x weekly - 10 reps - Side Stepping with Resistance at Thighs  - 1 x daily - 7 x weekly - 1 sets - 4 reps - Seated Pelvic Floor Contraction  - 3 x daily - 7 x weekly - 1 sets - 10 reps - 3-5 sec hold - Supine Lower Trunk Rotation  - 1 x daily - 3 x weekly - 1 sets - 15 reps - Standing 3-Way Leg Reach with Resistance at Ankles and Counter Support  - 1 x daily - 3 x weekly - 2 sets - 10 reps - Scapular Retraction with Resistance  - 1 x daily - 3 x weekly - 2 sets - 10 reps - Supine Dead Bug with Leg Extension  - 1 x daily - 3 x weekly - 3 sets - 10 reps - Supine Bridge with Pelvic Floor Contraction  - 1 x daily - 3 x weekly - 2 sets - 10  reps    ASSESSMENT:  CLINICAL IMPRESSION: Patient is a 73 y.o. female who was seen today for physical therapy  treatment for urge incontinence.rge to  void Patient will  leak urine in the morning prior to getting up out of bed. She has leaked 3 times since last session  Patient only wears a pad due to the vaginal discharge.  Squat 10 x without urinary leakage or pressure in the pelvic floor. Patient was ready for several of the exercises to be progressed. Patient will benefit from skilled therapy to improve pelvic floor contraction and coordination while working on her balance to walk to the bathroom safely.   OBJECTIVE IMPAIRMENTS: decreased activity tolerance, decreased balance, decreased coordination, decreased strength, and increased fascial restrictions.   ACTIVITY LIMITATIONS: lifting, bending, squatting, continence, toileting, and locomotion level  PARTICIPATION LIMITATIONS: cleaning, laundry, shopping, and community activity  PERSONAL FACTORS: Age, Fitness, and 1-2 comorbidities: Anal fissure; Diverticulosis; IBS; Osteoporosis;  are also affecting patient's functional outcome.   REHAB POTENTIAL: Excellent  CLINICAL DECISION MAKING: Evolving/moderate complexity  EVALUATION COMPLEXITY: Moderate   GOALS: Goals reviewed with patient? Yes  SHORT TERM GOALS: Target date: 01/26/24  Patient independent with initial HEP for pelvic floor strength and elongation.  Baseline: Goal status: Met 01/12/24  2.  Patient educated on urge to urinate to reduce urinary urgency.  Baseline:  Goal status: Met 01/28/24  3.  Patient educated on perineal massage to improve tissue mobility.  Baseline:  Goal status: Met 01/12/24 4.  Patient educated patient on vaginal moisturizers to improve health of tissue.  Baseline:  Goal status: Met 01/05/24  LONG TERM GOALS: Target date: 03/22/24  Patient independent with core, hip and pelvic floor strength to continue with continence.  Baseline:  Goal status:  INITIAL  2.  Patient is able to use the urge to void behavioral technique to walk to the bathroom slowly and not leak urine.  Baseline:  Goal status: Met 01/28/24  3.  Patient is bend forward and lift an item with urinary leakage decreased >/= 80% with good body mechanics, pressure management and increased pelvic floor strength >/= 3/5.  Baseline:  Goal status: INITIAL  4.  Patient is able to squat without losing her balance to lift items off the floor and without leaking urine using pressure management with pelvic floor strength >/= 3/5.  Baseline:  Goal status: Met 03/01/24  5.  Patient is able to vacuum with no urinary leakage due to pressure management and increased in pelvic floor strength with increased in hip strength.  Baseline:  Goal status:Met 03/01/24   PLAN:  PT FREQUENCY: 1-2x/week  PT DURATION: 12 weeks  PLANNED INTERVENTIONS: 97110-Therapeutic exercises, 97530- Therapeutic activity, 97112- Neuromuscular re-education, 97535- Self Care, [id number]- Manual therapy, Patient/Family education, Balance training, Dry Needling, Cryotherapy, Moist heat, and Biofeedback  PLAN FOR NEXT SESSION:  balance exercises,  standing core strength, if doing well then discharge   Channing Pereyra, PT 03/01/24 11:19 AM

## 2024-03-11 ENCOUNTER — Telehealth: Payer: Self-pay | Admitting: *Deleted

## 2024-03-11 NOTE — Telephone Encounter (Signed)
 TC from pt in VM.  Returned call DOB verified.  Pt states has had a spot or two over the past 2 weeks with removing the pessary with some discharge.  She states no pain with discharge.  Pt states due to take pessary out Monday, advised pt to call us  to let us  know how if there is blood on the pessary and how much discharge is.  Pt will call back and ask for me Monday.  If there is more I will move up her appt. Sotero CMA/ Dr Marilynne

## 2024-03-14 NOTE — Telephone Encounter (Signed)
 Patient returned call. She states she took out her pessary yesterday and had blood on her panty liner, about the size of a quarter. She still has not put her pessary back in, but not seen any more bleeding. She is concerned about increase dryness even with using her estrogen cream. Pt scheduled for 03-15-2024.

## 2024-03-15 ENCOUNTER — Ambulatory Visit (INDEPENDENT_AMBULATORY_CARE_PROVIDER_SITE_OTHER): Admitting: Obstetrics and Gynecology

## 2024-03-15 ENCOUNTER — Encounter: Payer: Self-pay | Admitting: Obstetrics and Gynecology

## 2024-03-15 VITALS — BP 147/82 | HR 82

## 2024-03-15 DIAGNOSIS — N812 Incomplete uterovaginal prolapse: Secondary | ICD-10-CM

## 2024-03-15 DIAGNOSIS — Z960 Presence of urogenital implants: Secondary | ICD-10-CM | POA: Diagnosis not present

## 2024-03-15 DIAGNOSIS — N952 Postmenopausal atrophic vaginitis: Secondary | ICD-10-CM | POA: Diagnosis not present

## 2024-03-15 DIAGNOSIS — Z4689 Encounter for fitting and adjustment of other specified devices: Secondary | ICD-10-CM

## 2024-03-15 MED ORDER — ESTRADIOL 7.5 MCG/24HR VA RING
1.0000 | VAGINAL_RING | VAGINAL | 3 refills | Status: AC
Start: 2024-03-15 — End: ?

## 2024-03-15 MED ORDER — ESTRADIOL 7.5 MCG/24HR VA RING: 1.0000 | VAGINAL_RING | VAGINAL | 3 refills | Status: DC

## 2024-03-15 MED ORDER — ESTRADIOL 7.5 MCG/24HR VA RING: 1.0000 | VAGINAL_RING | VAGINAL | 3 refills | Status: AC

## 2024-03-15 NOTE — Progress Notes (Signed)
 [facility] Return Visit  SUBJECTIVE  History of Present Illness: Deborah Conner is a 73 y.o. female seen in follow-up for prolapse and ABL.  #4 RWS pessary has been working well for her. Removing every 2 weeks. Has a little soreness with removing it. Has been using estrace  cream twice a week. Used v-magic for about a week but felt that it caused more irritation, had more itching.   Stopped benefiber for some time. Noticed a little more constipation and had to push. Noticed a little spotting after that episode. Took her pessary out that evening and noticed some fresh blood around the edge. Left the pessary out and had a little spotting for a day then not much after.   Has been doing pelvic physical therapy. She feels it is going well. Has helped with her bladder urgency.   Past Medical History: Patient  has a past medical history of Anal fissure, Anxiety and depression (1998/1973), Aortic sclerosis (2005), Diverticulosis (2003), GERD (gastroesophageal reflux disease) (2001), Gestational diabetes, Glucose intolerance (impaired glucose tolerance) (2000), Heart murmur, HLD (hyperlipidemia), IBS (irritable bowel syndrome) (1998), Insomnia (2003), Internal hemorrhoids, Lactose intolerance (1998), Migraine headache (childhood), Osteoporosis (2010), Polyarthralgia, Rosacea (1975), Seborrheic dermatitis (age 61), Small intestinal bacterial overgrowth (08/25/2018), Tinnitus (1999), and Uterine prolapse, Pessary tx (12/09/2011).   Past Surgical History: She  has a past surgical history that includes Upper gastrointestinal endoscopy (01/30/2006, 04/11/2011); Colonoscopy (2003, 04/11/2011); and Cataract extraction (Right, 10/2021).   Medications: She has a current medication list which includes the following prescription(s): acyclovir ointment, alprazolam, estradiol , estradiol , fluocinolone acetonide scalp, fluocinonide, furosemide, hyoscyamine , januvia, losartan, metformin, paroxetine,  polyethyl glycol-propyl glycol, pravastatin, prednisolone acetate, PRESCRIPTION MEDICATION, actemra, torsemide, urea , valacyclovir, fluad  quadrivalent, UNABLE TO FIND, and vitamin d.   Allergies: Patient is allergic to other, sulfa antibiotics, sulfasalazine, alendronate sodium, lactose, lactose intolerance (gi), latanoprost, and chlorthalidone.   Social History: Patient  reports that she has never smoked. She has never used smokeless tobacco. She reports that she does not drink alcohol and does not use drugs.     OBJECTIVE     Physical Exam: Vitals:   03/15/24 0831  BP: (!) 147/82  Pulse: 82    Gen: No apparent distress, A&O x 3.  Detailed Urogynecologic Evaluation:  Pessary removed. On speculum, healing abrasions noted at the apex and on the cervix, no active bleeding present.    ASSESSMENT AND PLAN    Ms. Lepp is a 73 y.o. with:  1. Uterovaginal prolapse, incomplete   2. Pessary maintenance   3. Vaginal atrophy     - Leave pessary out and place estrace  cream for a week. She does not have an applicator so will ask the pharmacy if they are available. Otherwise ok to use finger but try to get up as high as possible. Then replace pessary after a week and maintain as normal.  - We also discussed the option of an estring  which is placed q3 months behind the pessary. She likes this option so will order- unsure about potential cost. If not able to get it, will continue the estrace  cream twice a week after pessary is replaced.  - Recommended coconut oil or vitamin E as an alternative for vaginal moisture as needed - Restart benefiber and can also add miralax as needed for constipation symptoms.   Return 4 months for pessary check or sooner if needed   Rosaline LOISE Caper, MD

## 2024-03-15 NOTE — Addendum Note (Signed)
 Addended by: MARILYNNE ROSALINE SAILOR on: 03/15/2024 04:35 PM   Modules accepted: Orders

## 2024-03-15 NOTE — Addendum Note (Signed)
 Addended by: MARILYNNE ROSALINE SAILOR on: 03/15/2024 09:15 AM   Modules accepted: Orders

## 2024-03-15 NOTE — Patient Instructions (Addendum)
 Use estrogen cream nightly for a week then reinsert the pessary as normal.   Vulvovaginal moisturizer Options: Vitamin E oil (pump or capsule) or cream (Gene's Vit E Cream) Coconut oil Crisco Consider the ingredients of the product - the fewer the ingredients the better!  Directions for Use: Clean and dry your hands Gently dab the vulvar/vaginal area dry as needed Apply a "pea-sized" amount of the moisturizer onto your fingertip Using you other hand, open the labia  Apply the moisturizer to the vulvar/vaginal tissues Wear loose fitting underwear/clothing if possible following application Use moisturize up to 3 times daily as desired.   Constipation: Our goal is to achieve formed bowel movements daily or every-other-day.  You may need to try different combinations of the following options to find what works best for you - everybody's body works differently so feel free to adjust the dosages as needed.  Some options to help maintain bowel health include:  Dietary changes (more leafy greens, vegetables and fruits; less processed foods) Fiber supplementation (Benefiber, FiberCon, Metamucil or Psyllium). Start slow and increase gradually to full dose. Over-the-counter agents such as: stool softeners (Docusate or Colace) and/or laxatives (Miralax, milk of magnesia)  Power Pudding is a natural mixture that may help your constipation.  To make blend 1 cup applesauce, 1 cup wheat bran, and 3/4 cup prune juice, refrigerate and then take 1 tablespoon daily with a large glass of water as needed.

## 2024-03-17 ENCOUNTER — Ambulatory Visit
Admission: RE | Admit: 2024-03-17 | Discharge: 2024-03-17 | Disposition: A | Discharge status: Home / Self Care | Source: Ambulatory Visit | Attending: Family Medicine | Admitting: Family Medicine

## 2024-03-17 DIAGNOSIS — Z1231 Encounter for screening mammogram for malignant neoplasm of breast: Secondary | ICD-10-CM

## 2024-03-21 ENCOUNTER — Ambulatory Visit: Payer: Medicare Other | Admitting: Obstetrics and Gynecology

## 2024-03-22 ENCOUNTER — Ambulatory Visit: Admitting: Obstetrics and Gynecology

## 2024-03-22 ENCOUNTER — Encounter: Payer: Self-pay | Admitting: Physical Therapy

## 2024-03-22 ENCOUNTER — Telehealth: Payer: Self-pay | Admitting: Physical Therapy

## 2024-03-22 NOTE — Telephone Encounter (Signed)
 Spoke to patient on the phone to see if she is ready for discharge. She reports she cancelled todays appointment due to going out of town for a trip. She will call when she comes back to schedule further appts.  Channing Pereyra, PT @8 /12/25@ 2:40 PM

## 2024-04-15 ENCOUNTER — Ambulatory Visit: Admitting: Cardiology

## 2024-04-25 ENCOUNTER — Other Ambulatory Visit: Payer: Self-pay

## 2024-04-25 MED ORDER — COVID-19 MRNA VAC-TRIS(PFIZER) 30 MCG/0.3ML IM SUSY
0.3000 mL | PREFILLED_SYRINGE | Freq: Once | INTRAMUSCULAR | 0 refills | Status: AC
  Filled 2024-04-25: qty 0.3, 1d supply, fill #0

## 2024-04-26 ENCOUNTER — Ambulatory Visit: Admitting: Obstetrics and Gynecology

## 2024-05-03 ENCOUNTER — Ambulatory Visit (INDEPENDENT_AMBULATORY_CARE_PROVIDER_SITE_OTHER): Admitting: Podiatry

## 2024-05-03 ENCOUNTER — Encounter: Payer: Self-pay | Admitting: Podiatry

## 2024-05-03 DIAGNOSIS — M79675 Pain in left toe(s): Secondary | ICD-10-CM | POA: Diagnosis not present

## 2024-05-03 DIAGNOSIS — B351 Tinea unguium: Secondary | ICD-10-CM | POA: Diagnosis not present

## 2024-05-03 DIAGNOSIS — M79674 Pain in right toe(s): Secondary | ICD-10-CM

## 2024-05-03 DIAGNOSIS — E1151 Type 2 diabetes mellitus with diabetic peripheral angiopathy without gangrene: Secondary | ICD-10-CM

## 2024-05-03 NOTE — Progress Notes (Signed)
  Subjective:  Patient ID: Deborah Conner, female    DOB: [birth year],   MRN: [id number]  Chief Complaint  Patient presents with   Diabetes    She clips my nails.  I usually have one little hard place on my left foot that she trims.    73 y.o. female presents for routine foot care in a high risk foot. . Relates continued tenderness to left third digit.  Her blood sugars have been high and relates A1c was up to 14. Most recent in January was 6.3.Requesting nail trim today . Denies any other pedal complaints. Denies n/v/f/c.   PCP: Glendia Freeman MD   Past Medical History:  Diagnosis Date   Anal fissure    Anxiety and depression 1998/1973   some panic   Aortic sclerosis 2005   Diverticulosis 2003   GERD (gastroesophageal reflux disease) 2001   Gestational diabetes    Glucose intolerance (impaired glucose tolerance) 2000   Heart murmur    HLD (hyperlipidemia)    IBS (irritable bowel syndrome) 1998   Insomnia 2003   Internal hemorrhoids    Lactose intolerance 1998   Migraine headache childhood   Osteoporosis 2010   Polyarthralgia    Rosacea 1975   Seborrheic dermatitis age 36   Small intestinal bacterial overgrowth 08/25/2018   Tinnitus 1999   Uterine prolapse, 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 CFT <3 seconds. Normal hair growth on digits. No edema.  Skin. No lacerations or abrasions bilateral feet. Nails 1-5 are thickened discolored and elongated with subungual debris.  Hyperkearotic tissue noted to lateral nail border of third digit with tenderness.  Musculoskeletal: MMT 5/5 bilateral lower extremities in DF, PF, Inversion and Eversion. Deceased ROM in DF of ankle joint.  Neurological: Sensation intact to light touch.   Assessment:    1. Pain due to onychomycosis of toenails of both feet   2. Type II diabetes mellitus with peripheral circulatory disorder (HCC)        Plan:  Patient was evaluated and  treated and all questions answered. -Discussed and educated patient on diabetic foot care, especially with  regards to the vascular, neurological and musculoskeletal systems.  -Stressed the importance of good glycemic control and the detriment of not  controlling glucose levels in relation to the foot. -Discussed supportive shoes at all times and checking feet regularly.  -Mechanically debrided all nails 1-5 bilateral using sterile nail nipper and filed with dremel without incident  -Answered all patient questions -Patient to return  in 3 months for at risk foot care -Patient advised to call the office if any problems or questions arise in the meantime.   Asberry Failing, DPM

## 2024-07-13 ENCOUNTER — Ambulatory Visit: Admitting: Obstetrics and Gynecology

## 2024-07-13 ENCOUNTER — Encounter: Payer: Self-pay | Admitting: Obstetrics and Gynecology

## 2024-07-13 VITALS — BP 119/76 | HR 78

## 2024-07-13 DIAGNOSIS — N812 Incomplete uterovaginal prolapse: Secondary | ICD-10-CM | POA: Diagnosis not present

## 2024-07-13 DIAGNOSIS — N3941 Urge incontinence: Secondary | ICD-10-CM

## 2024-07-13 NOTE — Progress Notes (Signed)
 [facility] Return Visit  SUBJECTIVE  History of Present Illness: NYSA SARIN is a 73 y.o. female seen in follow-up for prolapse and ABL.  #4 RWS pessary has been working well for her. Removing about every 2 weeks. Has not had any issues. Has been using estrace  cream twice a week.   She had two urinary accidents in the last week. But otherwise have been able to hold it usually.  Physical therapy has been going well. She did have to add furosemide recently to her BP regimen.    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   Past Surgical History: She  has a past surgical history that includes Upper gastrointestinal endoscopy (01/30/2006, 04/11/2011); Colonoscopy (2003, 04/11/2011); and Cataract extraction (Right, 10/2021).   Medications: She has a current medication list which includes the following prescription(s): acyclovir ointment, alprazolam, estradiol , estradiol , estradiol , fluocinolone acetonide scalp, fluocinonide, furosemide, hyoscyamine , fluad  quadrivalent, januvia, losartan, metformin, metoprolol succinate, paroxetine, polyethyl glycol-propyl glycol, pravastatin, prednisolone acetate, PRESCRIPTION MEDICATION, actemra, torsemide, urea , valacyclovir, and vitamin d.   Allergies: Patient is allergic to other, sulfa antibiotics, sulfasalazine, alendronate sodium,  lactose, lactose intolerance (gi), latanoprost, and chlorthalidone.   Social History: Patient  reports that she has never smoked. She has never used smokeless tobacco. She reports that she does not drink alcohol and does not use drugs.     OBJECTIVE     Physical Exam: Vitals:   07/13/24 1026  BP: 119/76  Pulse: 78     Gen: No apparent distress, A&O x 3.  Detailed Urogynecologic Evaluation:  Small abrasion noted at introitus. Pessary removed and cleaned. On speculum, normal vaginal mucosa without abrasions. Pessary replaced   ASSESSMENT AND PLAN    Ms. Belcourt is a 73 y.o. with:  1. Uterovaginal prolapse, incomplete   2. Urge incontinence      - Continue pessary cleanings q 4 months - Continue estrace  cream twice a week. Also recommended to place some at the introitus as well as internally.  - Recommended coconut oil or vitamin E as an alternative for vaginal moisture as needed - For urge incontinence, we discussed this may be due to addition of furosemide. Recommended avoiding bladder irritants and monitoring for now. If symptoms are persistent, can consider a medication.   Return 4 months for pessary check or sooner if needed   Rosaline LOISE Caper, MD

## 2024-08-01 ENCOUNTER — Ambulatory Visit: Admitting: Podiatry

## 2024-08-09 ENCOUNTER — Encounter: Payer: Self-pay | Admitting: Podiatry

## 2024-08-09 ENCOUNTER — Ambulatory Visit (INDEPENDENT_AMBULATORY_CARE_PROVIDER_SITE_OTHER): Admitting: Podiatry

## 2024-08-09 DIAGNOSIS — M79675 Pain in left toe(s): Secondary | ICD-10-CM | POA: Diagnosis not present

## 2024-08-09 DIAGNOSIS — E1151 Type 2 diabetes mellitus with diabetic peripheral angiopathy without gangrene: Secondary | ICD-10-CM

## 2024-08-09 DIAGNOSIS — L84 Corns and callosities: Secondary | ICD-10-CM

## 2024-08-09 DIAGNOSIS — B351 Tinea unguium: Secondary | ICD-10-CM

## 2024-08-09 DIAGNOSIS — M79674 Pain in right toe(s): Secondary | ICD-10-CM

## 2024-08-09 NOTE — Progress Notes (Signed)
"  °  Subjective:  Patient ID: Deborah Conner, female    DOB: 09-06-1950,   MRN: [id number]  Chief Complaint  Patient presents with   [facility]    Rm21 Diabetic foot care/ A1c 5.7    73 y.o. female presents for routine foot care in a high risk foot. . Relates tender to fifth digit on left. .  Her blood sugars have been high and relates A1c was up to 14. Most recent in January was 5.7.Requesting nail trim today . Denies any other pedal complaints. Denies n/v/f/c.   PCP: Glendia Freeman MD   Past Medical History:  Diagnosis Date   Anal fissure    Anxiety and depression 1998/1973   some panic   Aortic sclerosis 2005   Diverticulosis 2003   GERD (gastroesophageal reflux disease) 2001   Gestational diabetes    Glucose intolerance (impaired glucose tolerance) 2000   Heart murmur    HLD (hyperlipidemia)    IBS (irritable bowel syndrome) 1998   Insomnia 2003   Internal hemorrhoids    Lactose intolerance 1998   Migraine headache childhood   Osteoporosis 2010   Polyarthralgia    Rosacea 1975   Seborrheic dermatitis age 38   Small intestinal bacterial overgrowth 08/25/2018   Tinnitus 1999   Uterine prolapse, 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 CFT <3 seconds. Normal hair growth on digits. No edema.  Skin. No lacerations or abrasions bilateral feet. Nails 1-5 are thickened discolored and elongated with subungual debris.  Hyperkearotic tissue noted to lateral plantar fifth metatarsal head on left Musculoskeletal: MMT 5/5 bilateral lower extremities in DF, PF, Inversion and Eversion. Deceased ROM in DF of ankle joint.  Neurological: Sensation intact to light touch.   Assessment:    1. Pain due to onychomycosis of toenails of both feet   2. Type II diabetes mellitus with peripheral circulatory disorder (HCC)   3. Callus of foot        Plan:  Patient was evaluated and treated and all questions answered. -Discussed and educated  patient on diabetic foot care, especially with  regards to the vascular, neurological and musculoskeletal systems.  -Stressed the importance of good glycemic control and the detriment of not  controlling glucose levels in relation to the foot. -Discussed supportive shoes at all times and checking feet regularly.  -Mechanically debrided all nails 1-5 bilateral using sterile nail nipper and filed with dremel without incident  -Hyperkeratotic lesion to the left fifth metatarsal head debrided without incident as courtesy today.  Discussed prevention techniques for this area. -Answered all patient questions -Patient to return  in 3 months for at risk foot care -Patient advised to call the office if any problems or questions arise in the meantime.   Asberry Failing, DPM   "

## 2024-11-08 ENCOUNTER — Ambulatory Visit: Admitting: Podiatry

## 2024-11-11 ENCOUNTER — Ambulatory Visit: Admitting: Obstetrics and Gynecology
